# Patient Record
Sex: Female | Born: 1985
Health system: Southern US, Community
[De-identification: ages and names within clinical notes are randomized; demographics above are authoritative.]

## PROBLEM LIST (undated history)

## (undated) ENCOUNTER — Inpatient Hospital Stay (HOSPITAL_COMMUNITY): Payer: Self-pay

## (undated) DIAGNOSIS — Z862 Personal history of diseases of the blood and blood-forming organs and certain disorders involving the immune mechanism: Secondary | ICD-10-CM

## (undated) DIAGNOSIS — I10 Essential (primary) hypertension: Secondary | ICD-10-CM

## (undated) DIAGNOSIS — R51 Headache: Secondary | ICD-10-CM

## (undated) DIAGNOSIS — Z808 Family history of malignant neoplasm of other organs or systems: Secondary | ICD-10-CM

## (undated) DIAGNOSIS — Z8759 Personal history of other complications of pregnancy, childbirth and the puerperium: Secondary | ICD-10-CM

## (undated) DIAGNOSIS — L989 Disorder of the skin and subcutaneous tissue, unspecified: Secondary | ICD-10-CM

## (undated) DIAGNOSIS — D649 Anemia, unspecified: Secondary | ICD-10-CM

## (undated) DIAGNOSIS — G43909 Migraine, unspecified, not intractable, without status migrainosus: Secondary | ICD-10-CM

## (undated) DIAGNOSIS — Z973 Presence of spectacles and contact lenses: Secondary | ICD-10-CM

## (undated) DIAGNOSIS — Z803 Family history of malignant neoplasm of breast: Secondary | ICD-10-CM

## (undated) DIAGNOSIS — Z8042 Family history of malignant neoplasm of prostate: Secondary | ICD-10-CM

## (undated) DIAGNOSIS — O139 Gestational [pregnancy-induced] hypertension without significant proteinuria, unspecified trimester: Secondary | ICD-10-CM

## (undated) DIAGNOSIS — R102 Pelvic and perineal pain unspecified side: Secondary | ICD-10-CM

## (undated) HISTORY — PX: EYE SURGERY: SHX253

## (undated) HISTORY — DX: Family history of malignant neoplasm of prostate: Z80.42

## (undated) HISTORY — DX: Family history of malignant neoplasm of other organs or systems: Z80.8

## (undated) HISTORY — PX: CHOLESTEATOMA EXCISION: SHX1345

## (undated) HISTORY — DX: Family history of malignant neoplasm of breast: Z80.3

## (undated) HISTORY — PX: LASIK: SHX215

## (undated) HISTORY — PX: MELANOMA EXCISION: SHX5266

## (undated) HISTORY — PX: TUBAL LIGATION: SHX77

## (undated) HISTORY — PX: ABDOMINAL HYSTERECTOMY: SHX81

## (undated) HISTORY — PX: KNEE ARTHROSCOPY: SUR90

## (undated) HISTORY — PX: TONSILLECTOMY: SUR1361

## (undated) HISTORY — PX: CHOLECYSTECTOMY: SHX55

---
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
## 2004-08-07 ENCOUNTER — Ambulatory Visit (HOSPITAL_BASED_OUTPATIENT_CLINIC_OR_DEPARTMENT_OTHER): Admission: RE | Admit: 2004-08-07 | Discharge: 2004-08-07 | Payer: Self-pay | Admitting: Surgery

## 2004-08-07 ENCOUNTER — Ambulatory Visit (HOSPITAL_COMMUNITY): Admission: RE | Admit: 2004-08-07 | Discharge: 2004-08-07 | Payer: Self-pay | Admitting: Surgery

## 2004-08-07 ENCOUNTER — Encounter (INDEPENDENT_AMBULATORY_CARE_PROVIDER_SITE_OTHER): Payer: Self-pay | Admitting: Specialist

## 2004-08-07 HISTORY — PX: NEVUS EXCISION: SHX2090

## 2004-10-16 ENCOUNTER — Emergency Department (HOSPITAL_COMMUNITY): Admission: EM | Admit: 2004-10-16 | Discharge: 2004-10-16 | Payer: Self-pay | Admitting: *Deleted

## 2004-11-06 ENCOUNTER — Ambulatory Visit: Payer: Self-pay | Admitting: Orthopedic Surgery

## 2004-11-17 ENCOUNTER — Encounter (HOSPITAL_COMMUNITY): Admission: RE | Admit: 2004-11-17 | Discharge: 2004-12-19 | Payer: Self-pay | Admitting: Orthopedic Surgery

## 2004-12-10 ENCOUNTER — Other Ambulatory Visit: Admission: RE | Admit: 2004-12-10 | Discharge: 2004-12-10 | Payer: Self-pay | Admitting: Gynecology

## 2004-12-22 ENCOUNTER — Ambulatory Visit: Payer: Self-pay | Admitting: Orthopedic Surgery

## 2005-10-26 ENCOUNTER — Ambulatory Visit: Payer: Self-pay | Admitting: Orthopedic Surgery

## 2005-11-25 ENCOUNTER — Ambulatory Visit (HOSPITAL_COMMUNITY): Admission: RE | Admit: 2005-11-25 | Discharge: 2005-11-25 | Payer: Self-pay | Admitting: Orthopedic Surgery

## 2005-12-02 ENCOUNTER — Ambulatory Visit: Payer: Self-pay | Admitting: Orthopedic Surgery

## 2005-12-15 ENCOUNTER — Other Ambulatory Visit: Admission: RE | Admit: 2005-12-15 | Discharge: 2005-12-15 | Payer: Self-pay | Admitting: Gynecology

## 2006-03-10 ENCOUNTER — Ambulatory Visit: Payer: Self-pay | Admitting: Orthopedic Surgery

## 2006-04-06 HISTORY — PX: CHOLECYSTECTOMY, LAPAROSCOPIC: SHX56

## 2006-12-27 ENCOUNTER — Other Ambulatory Visit: Admission: RE | Admit: 2006-12-27 | Discharge: 2006-12-27 | Payer: Self-pay | Admitting: Gynecology

## 2007-04-07 HISTORY — PX: KNEE ARTHROSCOPY: SUR90

## 2007-12-21 ENCOUNTER — Other Ambulatory Visit: Admission: RE | Admit: 2007-12-21 | Discharge: 2007-12-21 | Payer: Self-pay | Admitting: Gynecology

## 2009-01-31 ENCOUNTER — Encounter: Admission: RE | Admit: 2009-01-31 | Discharge: 2009-01-31 | Payer: Self-pay | Admitting: Internal Medicine

## 2010-08-22 NOTE — Op Note (Signed)
[name], [name]             ACCOUNT NO.:  [id number]   MEDICAL RECORD NO.:  [id number]          PATIENT TYPE:  AMB   LOCATION:  DSC                          FACILITY:  [facility]   PHYSICIAN:  Currie Paris, M.D.DATE OF BIRTH:  [birth year]   DATE OF PROCEDURE:  08/07/2004  DATE OF DISCHARGE:                                 OPERATIVE REPORT   MEDICAL DECISION MAKING:  [id number].   PREOPERATIVE DIAGNOSIS:  Multiple dysplastic nevi with atypia.   POSTOPERATIVE DIAGNOSIS:  Multiple dysplastic nevi with atypia.   OPERATION/PROCEDURE:  Excision for nevi.   SURGEON:  Dr. Jamey Scott.   ANESTHESIA:  MAC.   CLINICAL HISTORY:  Barbara Scott is a 25 year old who has had a nevus removed from  the left upper breast and three from the back.  The breast and two of the  back ones showed moderate to marked melanocytic atypia and the fourth one  showed slight to moderate monocytic atypia.  It was decided to re-excise  these areas with wider margins planning on a 4 mm margin for the ones with  moderate to marked atypical and a 1 mm margin on the one with the slight  atypia.   DESCRIPTION OF PROCEDURE:  The patient was seen in the holding area and had  no further questions.  All four areas were identified by the patient and  marked by me.   She was taken to the operating room and after satisfactory IV sedation, the  area of the left breast was prepped and draped and the time out occurred.  I  infiltrated 1% Xylocaine with epinephrine.  I outlined and I made a circle  around the lesion so that I had a 4 mm margin and then outlined an  elliptical incision to excise this.  The incision was made and the skin and  site of the previous nevus excision removed and labled and sent to  pathology.  The incision was closed in layers with 3-0 Vicryl followed by 6-  0 Prolene.  A sterile dressing was applied.  This was a 4 cm incision.   The patient was then turned prone and the back prepped and draped.  All  three areas were anesthetized again with 1% Xylocaine with epinephrine.  Again, circular areas were marked for margins around each and an elliptical  incision outlined.  The lower back one was done with a vertical incision in  the midline, the other two with transverse  incisions.  Each was excised and sent to pathology with appropriate labeling  for orientation.  Each incision was closed in layers with 3-0 Vicryl and 6-0  Prolene.  Two of the areas measured 4 cm and the third 3 cm.   The patient tolerated the procedure well.  There were no intraoperative  complications.  All counts were correct.      CJS/MEDQ  D:  08/07/2004  T:  08/07/2004  Job:  [id number]

## 2010-12-09 ENCOUNTER — Other Ambulatory Visit: Payer: Self-pay | Admitting: Physician Assistant

## 2010-12-09 ENCOUNTER — Ambulatory Visit
Admission: RE | Admit: 2010-12-09 | Discharge: 2010-12-09 | Disposition: A | Discharge status: Home / Self Care | Payer: Self-pay | Source: Ambulatory Visit | Attending: Physician Assistant | Admitting: Physician Assistant

## 2010-12-09 DIAGNOSIS — S96919A Strain of unspecified muscle and tendon at ankle and foot level, unspecified foot, initial encounter: Secondary | ICD-10-CM

## 2012-07-20 LAB — OB RESULTS CONSOLE RPR: RPR: NONREACTIVE

## 2012-07-20 LAB — OB RESULTS CONSOLE GC/CHLAMYDIA
Chlamydia: NEGATIVE
Gonorrhea: NEGATIVE

## 2012-07-20 LAB — OB RESULTS CONSOLE ABO/RH: "RH Type ": POSITIVE

## 2012-07-20 LAB — OB RESULTS CONSOLE HEPATITIS B SURFACE ANTIGEN: Hepatitis B Surface Ag: NEGATIVE

## 2012-07-20 LAB — OB RESULTS CONSOLE HIV ANTIBODY (ROUTINE TESTING): HIV: NONREACTIVE

## 2012-07-20 LAB — OB RESULTS CONSOLE ANTIBODY SCREEN: Antibody Screen: NEGATIVE

## 2012-07-20 LAB — OB RESULTS CONSOLE RUBELLA ANTIBODY, IGM: Rubella: IMMUNE

## 2013-01-25 LAB — OB RESULTS CONSOLE GBS: GBS: NEGATIVE

## 2013-02-11 ENCOUNTER — Inpatient Hospital Stay (EMERGENCY_DEPARTMENT_HOSPITAL)
Admission: AD | Admit: 2013-02-11 | Discharge: 2013-02-11 | Disposition: A | Discharge status: Home / Self Care | Payer: 59 | Source: Ambulatory Visit | Attending: Obstetrics and Gynecology | Admitting: Obstetrics and Gynecology

## 2013-02-11 ENCOUNTER — Encounter (HOSPITAL_COMMUNITY): Payer: Self-pay | Admitting: *Deleted

## 2013-02-11 DIAGNOSIS — O479 False labor, unspecified: Secondary | ICD-10-CM

## 2013-02-11 DIAGNOSIS — O139 Gestational [pregnancy-induced] hypertension without significant proteinuria, unspecified trimester: Secondary | ICD-10-CM

## 2013-02-11 DIAGNOSIS — IMO0002 Reserved for concepts with insufficient information to code with codable children: Secondary | ICD-10-CM | POA: Insufficient documentation

## 2013-02-11 LAB — CBC
HCT: 35 % — ABNORMAL LOW (ref 36.0–46.0)
Hemoglobin: 11.5 g/dL — ABNORMAL LOW (ref 12.0–15.0)
MCH: 28 pg (ref 26.0–34.0)
MCHC: 32.9 g/dL (ref 30.0–36.0)
MCV: 85.2 fL (ref 78.0–100.0)
Platelets: 220 10*3/uL (ref 150–400)
RBC: 4.11 MIL/uL (ref 3.87–5.11)
RDW: 14.8 % (ref 11.5–15.5)
WBC: 12.8 10*3/uL — ABNORMAL HIGH (ref 4.0–10.5)

## 2013-02-11 LAB — URINALYSIS, ROUTINE W REFLEX MICROSCOPIC
Bilirubin Urine: NEGATIVE
Glucose, UA: NEGATIVE mg/dL
Ketones, ur: NEGATIVE mg/dL
Nitrite: NEGATIVE
Protein, ur: NEGATIVE mg/dL
Specific Gravity, Urine: 1.015 (ref 1.005–1.030)
Urobilinogen, UA: 0.2 mg/dL (ref 0.0–1.0)
pH: 7 (ref 5.0–8.0)

## 2013-02-11 LAB — COMPREHENSIVE METABOLIC PANEL
ALT: 7 U/L (ref 0–35)
AST: 13 U/L (ref 0–37)
Albumin: 2.5 g/dL — ABNORMAL LOW (ref 3.5–5.2)
Alkaline Phosphatase: 143 U/L — ABNORMAL HIGH (ref 39–117)
BUN: 6 mg/dL (ref 6–23)
CO2: 23 mEq/L (ref 19–32)
Calcium: 9 mg/dL (ref 8.4–10.5)
Chloride: 106 mEq/L (ref 96–112)
Creatinine, Ser: 0.65 mg/dL (ref 0.50–1.10)
GFR calc Af Amer: >90 mL/min (ref >90)
GFR calc non Af Amer: >90 mL/min (ref >90)
Glucose, Bld: 80 mg/dL (ref 70–99)
Potassium: 4 mEq/L (ref 3.5–5.1)
Sodium: 139 mEq/L (ref 135–145)
Total Bilirubin: 0.2 mg/dL — ABNORMAL LOW (ref 0.3–1.2)
Total Protein: 5.5 g/dL — ABNORMAL LOW (ref 6.0–8.3)

## 2013-02-11 LAB — URIC ACID: Uric Acid, Serum: 7.2 mg/dL — ABNORMAL HIGH (ref 2.4–7.0)

## 2013-02-11 LAB — URINE MICROSCOPIC-ADD ON

## 2013-02-11 LAB — LACTATE DEHYDROGENASE: LDH: 147 U/L (ref 94–250)

## 2013-02-11 NOTE — MAU Provider Note (Signed)
History     CSN: [id number]  Arrival date and time: 02/11/13 4098   None     Chief Complaint  Patient presents with  . Contractions   HPI This is a 27 y.o. female at [redacted]w[redacted]d who presents for labor evaluation. She reports increased swelling in legs and seeing spots at times. No current headache, has had a couple this week.    RN Note: PT SAYS SHE HAS BEEN SWELLING IN LEGS- NOT GOING DOWN OVER NIGHT. LABS DRAW LAST WED- ALL NL. 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 Marland Kitchen SHE DENIES H/A- BUT HAS SEEN SPOTS.      OB History   Grav Para Term Preterm Abortions TAB SAB Ect Mult Living   1               History reviewed. No pertinent past medical history.  Past Surgical History  Procedure Laterality Date  . Tonsillectomy    . Eye surgery    . Knee arthroscopy    . Cholesteatoma excision    . Cholecystectomy    . Melanoma excision      History reviewed. No pertinent family history.  History  Substance Use Topics  . Smoking status: Never Smoker   . Smokeless tobacco: Not on file  . Alcohol Use: No    Allergies:  Allergies  Allergen Reactions  . Shrimp [Shellfish Allergy] Swelling  . Vicodin [Hydrocodone-Acetaminophen] Itching    Prescriptions prior to admission  Medication Sig Dispense Refill  . Prenatal Vit-Fe Fumarate-FA (PRENATAL MULTIVITAMIN) TABS tablet Take 1 tablet by mouth daily at 12 noon.        Review of Systems  Constitutional: Negative for fever and malaise/fatigue.  Eyes: Positive for blurred vision (seeing spots).  Gastrointestinal: Positive for abdominal pain (with contractions). Negative for nausea and vomiting.  Neurological: Negative for dizziness, focal weakness and headaches.   Physical Exam   Blood pressure 119/78, pulse 95, temperature 98.5 F (36.9 C), resp. rate 20, height 5\' 5"  (1.651 m), weight 219 lb 6.4 oz (99.519 kg).  Physical Exam  Constitutional: She is oriented to person, place, and time. She appears well-developed  and well-nourished. No distress.  HENT:  Head: Normocephalic.  Cardiovascular: Normal rate.   Respiratory: Effort normal.  GI: Soft. There is no tenderness. There is no rebound and no guarding.  Genitourinary: Vagina normal and uterus normal. No vaginal discharge found.  Dilation: 3.5 Effacement (%): 70 Cervical Position: Posterior Station: -2 Presentation: Vertex Exam by:: Anuhea Gassner CNM   Musculoskeletal: Normal range of motion. She exhibits edema (2+ lower extremities, 1+ hands).  Neurological: She is alert and oriented to person, place, and time. She has normal reflexes. She displays normal reflexes (1 beat clonus).  Skin: Skin is warm and dry.  Psychiatric: She has a normal mood and affect.  FHR reactive Irregular contractions every 4 minutes Results for orders placed during the hospital encounter of 02/11/13 (from the past 72 hour(s))  URINALYSIS, ROUTINE W REFLEX MICROSCOPIC     Status: Abnormal   Collection Time    02/11/13  6:00 AM      Result Value Range   Color, Urine YELLOW  YELLOW   APPearance CLEAR  CLEAR   Specific Gravity, Urine 1.015  1.005 - 1.030   pH 7.0  5.0 - 8.0   Glucose, UA NEGATIVE  NEGATIVE mg/dL   Hgb urine dipstick MODERATE (*) NEGATIVE   Bilirubin Urine NEGATIVE  NEGATIVE   Ketones, ur  NEGATIVE  NEGATIVE mg/dL   Protein, ur NEGATIVE  NEGATIVE mg/dL   Urobilinogen, UA 0.2  0.0 - 1.0 mg/dL   Nitrite NEGATIVE  NEGATIVE   Leukocytes, UA MODERATE (*) NEGATIVE  URINE MICROSCOPIC-ADD ON     Status: Abnormal   Collection Time    02/11/13  6:00 AM      Result Value Range   Squamous Epithelial / LPF FEW (*) RARE   WBC, UA 11-20  <3 WBC/hpf   RBC / HPF 7-10  <3 RBC/hpf   Bacteria, UA MANY (*) RARE  CBC     Status: Abnormal   Collection Time    02/11/13  6:48 AM      Result Value Range   WBC 12.8 (*) 4.0 - 10.5 K/uL   RBC 4.11  3.87 - 5.11 MIL/uL   Hemoglobin 11.5 (*) 12.0 - 15.0 g/dL   HCT 16.1 (*) 09.6 - 04.5 %   MCV 85.2  78.0 - 100.0 fL   MCH  28.0  26.0 - 34.0 pg   MCHC 32.9  30.0 - 36.0 g/dL   RDW 40.9  81.1 - 91.4 %   Platelets 220  150 - 400 K/uL  COMPREHENSIVE METABOLIC PANEL     Status: Abnormal   Collection Time    02/11/13  6:48 AM      Result Value Range   Sodium 139  135 - 145 mEq/L   Potassium 4.0  3.5 - 5.1 mEq/L   Chloride 106  96 - 112 mEq/L   CO2 23  19 - 32 mEq/L   Glucose, Bld 80  70 - 99 mg/dL   BUN 6  6 - 23 mg/dL   Creatinine, Ser 7.82  0.50 - 1.10 mg/dL   Calcium 9.0  8.4 - 95.6 mg/dL   Total Protein 5.5 (*) 6.0 - 8.3 g/dL   Albumin 2.5 (*) 3.5 - 5.2 g/dL   AST 13  0 - 37 U/L   ALT 7  0 - 35 U/L   Alkaline Phosphatase 143 (*) 39 - 117 U/L   Total Bilirubin 0.2 (*) 0.3 - 1.2 mg/dL   GFR calc non Af Amer >90  >90 mL/min   GFR calc Af Amer >90  >90 mL/min   Comment: (NOTE)     The eGFR has been calculated using the CKD EPI equation.     This calculation has not been validated in all clinical situations.     eGFR's persistently <90 mL/min signify possible Chronic Kidney     Disease.  LACTATE DEHYDROGENASE     Status: None   Collection Time    02/11/13  6:48 AM      Result Value Range   LDH 147  94 - 250 U/L  URIC ACID     Status: Abnormal   Collection Time    02/11/13  6:48 AM      Result Value Range   Uric Acid, Serum 7.2 (*) 2.4 - 7.0 mg/dL     MAU Course  Procedures  MDM Discussed with Dr Henderson Cloud, will extend labor check  Assessment and Plan  A:  SIUP at [redacted]w[redacted]d        Gestational hypertension with edema, no proteinuria  P:  Observe       Report to oncoming provider  [facility] 02/11/2013, 7:32 AM

## 2013-02-11 NOTE — MAU Note (Signed)
PT SAYS  SHE HAS BEEN SWELLING IN LEGS-  NOT GOING DOWN OVER NIGHT.  LABS DRAW LAST WED- ALL NL.  DR  TOOK OUT OF WORK  ON WED- PT IS  AN OR NURSE. Barbara Scott   SHE DENIES H/A- BUT HAS SEEN SPOTS.

## 2013-02-11 NOTE — MAU Note (Signed)
Water pitcher given.

## 2013-02-11 NOTE — MAU Note (Signed)
Cramping a lot yesterday. Having regular contractions this morning.

## 2013-02-12 LAB — URINE CULTURE: Colony Count: 25000

## 2013-02-13 ENCOUNTER — Inpatient Hospital Stay (EMERGENCY_DEPARTMENT_HOSPITAL)
Admission: AD | Admit: 2013-02-13 | Discharge: 2013-02-13 | Disposition: A | Discharge status: Home / Self Care | Payer: 59 | Source: Ambulatory Visit | Attending: Obstetrics and Gynecology | Admitting: Obstetrics and Gynecology

## 2013-02-13 ENCOUNTER — Encounter (HOSPITAL_COMMUNITY): Payer: Self-pay | Admitting: *Deleted

## 2013-02-13 ENCOUNTER — Telehealth (HOSPITAL_COMMUNITY): Payer: Self-pay | Admitting: *Deleted

## 2013-02-13 DIAGNOSIS — O133 Gestational [pregnancy-induced] hypertension without significant proteinuria, third trimester: Secondary | ICD-10-CM

## 2013-02-13 DIAGNOSIS — O139 Gestational [pregnancy-induced] hypertension without significant proteinuria, unspecified trimester: Secondary | ICD-10-CM

## 2013-02-13 HISTORY — DX: Headache: R51

## 2013-02-13 LAB — COMPREHENSIVE METABOLIC PANEL
ALT: 7 U/L (ref 0–35)
AST: 14 U/L (ref 0–37)
Albumin: 2.6 g/dL — ABNORMAL LOW (ref 3.5–5.2)
Alkaline Phosphatase: 143 U/L — ABNORMAL HIGH (ref 39–117)
BUN: 9 mg/dL (ref 6–23)
CO2: 21 mEq/L (ref 19–32)
Calcium: 9 mg/dL (ref 8.4–10.5)
Chloride: 105 mEq/L (ref 96–112)
Creatinine, Ser: 0.57 mg/dL (ref 0.50–1.10)
GFR calc Af Amer: >90 mL/min (ref >90)
GFR calc non Af Amer: >90 mL/min (ref >90)
Glucose, Bld: 78 mg/dL (ref 70–99)
Potassium: 4.1 mEq/L (ref 3.5–5.1)
Sodium: 136 mEq/L (ref 135–145)
Total Bilirubin: 0.2 mg/dL — ABNORMAL LOW (ref 0.3–1.2)
Total Protein: 5.6 g/dL — ABNORMAL LOW (ref 6.0–8.3)

## 2013-02-13 LAB — URINE MICROSCOPIC-ADD ON

## 2013-02-13 LAB — CBC
HCT: 34.9 % — ABNORMAL LOW (ref 36.0–46.0)
Hemoglobin: 11.5 g/dL — ABNORMAL LOW (ref 12.0–15.0)
MCH: 28.1 pg (ref 26.0–34.0)
MCHC: 33 g/dL (ref 30.0–36.0)
MCV: 85.3 fL (ref 78.0–100.0)
Platelets: 228 10*3/uL (ref 150–400)
RBC: 4.09 MIL/uL (ref 3.87–5.11)
RDW: 14.7 % (ref 11.5–15.5)
WBC: 12 10*3/uL — ABNORMAL HIGH (ref 4.0–10.5)

## 2013-02-13 LAB — URINALYSIS, ROUTINE W REFLEX MICROSCOPIC
Bilirubin Urine: NEGATIVE
Glucose, UA: NEGATIVE mg/dL
Ketones, ur: NEGATIVE mg/dL
Nitrite: NEGATIVE
Protein, ur: NEGATIVE mg/dL
Specific Gravity, Urine: 1.015 (ref 1.005–1.030)
Urobilinogen, UA: 0.2 mg/dL (ref 0.0–1.0)
pH: 7.5 (ref 5.0–8.0)

## 2013-02-13 LAB — LACTATE DEHYDROGENASE: LDH: 156 U/L (ref 94–250)

## 2013-02-13 LAB — URIC ACID: Uric Acid, Serum: 6.3 mg/dL (ref 2.4–7.0)

## 2013-02-13 NOTE — Telephone Encounter (Signed)
Preadmission screen  

## 2013-02-13 NOTE — MAU Note (Signed)
States was seen in MAU on Saturday with pain and elevated BP. States was 3.5cm. States through the night, had vaginal pain and lower abdomen. Hurts to move. Husband checked her BP and states it was elevated. States they went to MD office, was told no MD there yet so they came to MAU.

## 2013-02-13 NOTE — MAU Provider Note (Signed)
History     CSN: [id number]  Arrival date and time: 02/13/13 4098   First Provider Initiated Contact with Patient 02/13/13 [phone]      Chief Complaint  Patient presents with  . Hypertension  . Vaginal Pain   HPI Barbara Scott is a 27 y.o. G1P0 at [redacted]w[redacted]d who presents to MAU today with elevated BP. The patient states that she was in the shower this morning and was seeing floaters. Her husband took her BP and states it was 168/98 and 150/90 this morning. She states that her OB has been monitoring her BP x 1 month. She has been having peripheral edema, pressures in the lower abdomen and headache. She rates her headache now at 4/10. She denies taking any pain medications and states that "Tylenol doesn't work anymore." she denies blurred vision or RUQ pain. She denies vaginal bleeding, discharge or LOF. She reports contractions q 5-10 minutes since Saturday. The patient was seen here on Saturday with similar issues and had a complete work-up for pre-eclampsia that was mostly normal. The patient was dilated to 3.5 cm on Saturday as well.   OB History   Grav Para Term Preterm Abortions TAB SAB Ect Mult Living   1         0      Past Medical History  Diagnosis Date  . Headache(784.0)     migraines  . Kidney stones     Past Surgical History  Procedure Laterality Date  . Tonsillectomy    . Eye surgery    . Knee arthroscopy    . Cholesteatoma excision    . Cholecystectomy    . Melanoma excision      History reviewed. No pertinent family history.  History  Substance Use Topics  . Smoking status: Never Smoker   . Smokeless tobacco: Not on file  . Alcohol Use: No    Allergies:  Allergies  Allergen Reactions  . Shrimp [Shellfish Allergy] Swelling  . Vicodin [Hydrocodone-Acetaminophen] Itching    Prescriptions prior to admission  Medication Sig Dispense Refill  . Prenatal Vit-Fe Fumarate-FA (PRENATAL MULTIVITAMIN) TABS tablet Take 1 tablet by mouth daily at 12 noon.         Review of Systems  Constitutional: Negative for malaise/fatigue.  Eyes: Negative for blurred vision and double vision.  Gastrointestinal: Positive for abdominal pain. Negative for nausea, vomiting, diarrhea and constipation.  Genitourinary: Negative for dysuria, urgency and frequency.       Neg - vaginal bleeding, discharge, LOF  Neurological: Positive for headaches.   Physical Exam   Blood pressure 134/89, pulse 77, height 5\' 5"  (1.651 m), weight 220 lb (99.791 kg).  Physical Exam  Constitutional: She is oriented to person, place, and time. She appears well-developed and well-nourished. No distress.  HENT:  Head: Normocephalic and atraumatic.  Cardiovascular: Normal rate, regular rhythm and normal heart sounds.   Respiratory: Effort normal and breath sounds normal. No respiratory distress.  GI: Soft. Bowel sounds are normal. She exhibits no distension and no mass. There is tenderness (mild tenderness to palpation of the lower abdomen and RUQ). There is no rebound and no guarding.  Musculoskeletal: She exhibits edema (1+ pitting edema to the knee bilaterally).  Neurological: She is alert and oriented to person, place, and time. She has normal reflexes.  No clonus  Skin: Skin is warm and dry. No erythema.  Psychiatric: She has a normal mood and affect.  Dilation: 3.5 Effacement (%): 60 Cervical Position: Posterior Station: -2  Presentation: Vertex Exam by:: Sarajane Marek, RNC   Results for orders placed during the hospital encounter of 02/13/13 (from the past 24 hour(s))  URINALYSIS, ROUTINE W REFLEX MICROSCOPIC     Status: Abnormal   Collection Time    02/13/13  8:45 AM      Result Value Range   Color, Urine YELLOW  YELLOW   APPearance HAZY (*) CLEAR   Specific Gravity, Urine 1.015  1.005 - 1.030   pH 7.5  5.0 - 8.0   Glucose, UA NEGATIVE  NEGATIVE mg/dL   Hgb urine dipstick SMALL (*) NEGATIVE   Bilirubin Urine NEGATIVE  NEGATIVE   Ketones, ur NEGATIVE  NEGATIVE mg/dL    Protein, ur NEGATIVE  NEGATIVE mg/dL   Urobilinogen, UA 0.2  0.0 - 1.0 mg/dL   Nitrite NEGATIVE  NEGATIVE   Leukocytes, UA SMALL (*) NEGATIVE  URINE MICROSCOPIC-ADD ON     Status: Abnormal   Collection Time    02/13/13  8:45 AM      Result Value Range   Squamous Epithelial / LPF MANY (*) RARE   WBC, UA 3-6  <3 WBC/hpf   RBC / HPF 3-6  <3 RBC/hpf   Bacteria, UA FEW (*) RARE  CBC     Status: Abnormal   Collection Time    02/13/13  9:52 AM      Result Value Range   WBC 12.0 (*) 4.0 - 10.5 K/uL   RBC 4.09  3.87 - 5.11 MIL/uL   Hemoglobin 11.5 (*) 12.0 - 15.0 g/dL   HCT 16.1 (*) 09.6 - 04.5 %   MCV 85.3  78.0 - 100.0 fL   MCH 28.1  26.0 - 34.0 pg   MCHC 33.0  30.0 - 36.0 g/dL   RDW 40.9  81.1 - 91.4 %   Platelets 228  150 - 400 K/uL  COMPREHENSIVE METABOLIC PANEL     Status: Abnormal   Collection Time    02/13/13  9:52 AM      Result Value Range   Sodium 136  135 - 145 mEq/L   Potassium 4.1  3.5 - 5.1 mEq/L   Chloride 105  96 - 112 mEq/L   CO2 21  19 - 32 mEq/L   Glucose, Bld 78  70 - 99 mg/dL   BUN 9  6 - 23 mg/dL   Creatinine, Ser 7.82  0.50 - 1.10 mg/dL   Calcium 9.0  8.4 - 95.6 mg/dL   Total Protein 5.6 (*) 6.0 - 8.3 g/dL   Albumin 2.6 (*) 3.5 - 5.2 g/dL   AST 14  0 - 37 U/L   ALT 7  0 - 35 U/L   Alkaline Phosphatase 143 (*) 39 - 117 U/L   Total Bilirubin 0.2 (*) 0.3 - 1.2 mg/dL   GFR calc non Af Amer >90  >90 mL/min   GFR calc Af Amer >90  >90 mL/min  URIC ACID     Status: None   Collection Time    02/13/13  9:52 AM      Result Value Range   Uric Acid, Serum 6.3  2.4 - 7.0 mg/dL  LACTATE DEHYDROGENASE     Status: None   Collection Time    02/13/13  9:52 AM      Result Value Range   LDH 156  94 - 250 U/L    Patient Vitals for the past 24 hrs:  BP Pulse Height Weight  02/13/13 0951 134/89 mmHg 77 - -  02/13/13 0941 131/79 mmHg 84 - -  02/13/13 0931 135/90 mmHg 81 - -  02/13/13 0921 136/86 mmHg 87 - -  02/13/13 0911 148/96 mmHg 96 - -  02/13/13 0901 145/84  mmHg 89 - -  02/13/13 0855 140/88 mmHg 88 - -  02/13/13 0845 - - 5\' 5"  (1.651 m) 220 lb (99.791 kg)    Fetal Monitoring: Baseline: 135 bpm, moderate variability, + accelerations, one variable deceleration Contractions: q 5-9 minutes  MAU Course  Procedures None  MDM Discussed with Dr. Renaldo Fiddler. Repeat labs and continue to monitor blood pressures.  Discussed labs and patient BPs with Dr. Renaldo Fiddler. She is ok with discharge at this time. Has patient scheduled for induction tomorrow with Dr. Marcelle Overlie. Would like patient to call the office today for instructions about induction.  Assessment and Plan  A: Gestational HTN  P: Discharge home Pre-eclampsia warning signs reviewed Patient advised to call the office today for induction instructions Patient may return to MAU as needed or if her condition were to change or worsen  Freddi Starr, PA-C  02/13/2013, 10:54 AM

## 2013-02-14 ENCOUNTER — Encounter (HOSPITAL_COMMUNITY): Payer: Self-pay

## 2013-02-14 ENCOUNTER — Inpatient Hospital Stay (HOSPITAL_COMMUNITY)
Admission: AD | Admit: 2013-02-14 | Discharge: 2013-02-18 | DRG: 775 | Disposition: A | Discharge status: Home / Self Care | Payer: 59 | Source: Ambulatory Visit | Attending: Obstetrics and Gynecology | Admitting: Obstetrics and Gynecology

## 2013-02-14 ENCOUNTER — Inpatient Hospital Stay (HOSPITAL_COMMUNITY): Payer: 59 | Admitting: Anesthesiology

## 2013-02-14 ENCOUNTER — Encounter (HOSPITAL_COMMUNITY): Payer: 59 | Admitting: Anesthesiology

## 2013-02-14 DIAGNOSIS — O139 Gestational [pregnancy-induced] hypertension without significant proteinuria, unspecified trimester: Principal | ICD-10-CM | POA: Diagnosis present

## 2013-02-14 LAB — URINE CULTURE

## 2013-02-14 LAB — CBC
Hemoglobin: 11 g/dL — ABNORMAL LOW (ref 12.0–15.0)
Hemoglobin: 11.4 g/dL — ABNORMAL LOW (ref 12.0–15.0)
MCH: 28.4 pg (ref 26.0–34.0)
MCHC: 32.7 g/dL (ref 30.0–36.0)
MCV: 85.7 fL (ref 78.0–100.0)
RBC: 3.87 MIL/uL (ref 3.87–5.11)
RBC: 4.07 MIL/uL (ref 3.87–5.11)
RDW: 14.5 % (ref 11.5–15.5)
WBC: 10.6 10*3/uL — ABNORMAL HIGH (ref 4.0–10.5)
WBC: 21.8 10*3/uL — ABNORMAL HIGH (ref 4.0–10.5)

## 2013-02-14 LAB — COMPREHENSIVE METABOLIC PANEL
ALT: 7 U/L (ref 0–35)
AST: 15 U/L (ref 0–37)
Albumin: 2.6 g/dL — ABNORMAL LOW (ref 3.5–5.2)
Alkaline Phosphatase: 147 U/L — ABNORMAL HIGH (ref 39–117)
BUN: 8 mg/dL (ref 6–23)
CO2: 22 mEq/L (ref 19–32)
Calcium: 9.4 mg/dL (ref 8.4–10.5)
Chloride: 103 mEq/L (ref 96–112)
GFR calc Af Amer: >90 mL/min (ref >90)
GFR calc non Af Amer: >90 mL/min (ref >90)
Glucose, Bld: 63 mg/dL — ABNORMAL LOW (ref 70–99)
Potassium: 4.7 mEq/L (ref 3.5–5.1)
Sodium: 137 mEq/L (ref 135–145)

## 2013-02-14 LAB — RPR: RPR Ser Ql: NONREACTIVE

## 2013-02-14 LAB — TYPE AND SCREEN: Antibody Screen: NEGATIVE

## 2013-02-14 MED ORDER — IBUPROFEN 600 MG PO TABS: 600.0000 mg | ORAL_TABLET | Freq: Four times a day (QID) | ORAL | Status: DC | PRN

## 2013-02-14 MED ORDER — PHENYLEPHRINE 40 MCG/ML (10ML) SYRINGE FOR IV PUSH (FOR BLOOD PRESSURE SUPPORT)
80.0000 ug | PREFILLED_SYRINGE | INTRAVENOUS | Status: DC | PRN
  Filled 2013-02-14: qty 2
  Filled 2013-02-14: qty 10

## 2013-02-14 MED ORDER — TERBUTALINE SULFATE 1 MG/ML IJ SOLN: 0.2500 mg | Freq: Once | INTRAMUSCULAR | Status: AC | PRN

## 2013-02-14 MED ORDER — CITRIC ACID-SODIUM CITRATE 334-500 MG/5ML PO SOLN: 30.0000 mL | ORAL | Status: DC | PRN

## 2013-02-14 MED ORDER — OXYTOCIN BOLUS FROM INFUSION
500.0000 mL | INTRAVENOUS | Status: DC
  Administered 2013-02-14: 500 mL via INTRAVENOUS

## 2013-02-14 MED ORDER — OXYCODONE-ACETAMINOPHEN 5-325 MG PO TABS: 1.0000 | ORAL_TABLET | ORAL | Status: DC | PRN

## 2013-02-14 MED ORDER — LIDOCAINE HCL (PF) 1 % IJ SOLN
30.0000 mL | INTRAMUSCULAR | Status: AC | PRN
  Administered 2013-02-14: 30 mL via SUBCUTANEOUS
  Filled 2013-02-14 (×2): qty 30

## 2013-02-14 MED ORDER — EPHEDRINE 5 MG/ML INJ
10.0000 mg | INTRAVENOUS | Status: DC | PRN
  Filled 2013-02-14: qty 2
  Filled 2013-02-14: qty 4

## 2013-02-14 MED ORDER — EPHEDRINE 5 MG/ML INJ
10.0000 mg | INTRAVENOUS | Status: DC | PRN
  Filled 2013-02-14: qty 2

## 2013-02-14 MED ORDER — FLEET ENEMA 7-19 GM/118ML RE ENEM: 1.0000 | ENEMA | RECTAL | Status: DC | PRN

## 2013-02-14 MED ORDER — ACETAMINOPHEN 325 MG PO TABS
650.0000 mg | ORAL_TABLET | ORAL | Status: DC | PRN
  Administered 2013-02-14: 650 mg via ORAL
  Filled 2013-02-14: qty 2

## 2013-02-14 MED ORDER — BUTORPHANOL TARTRATE 1 MG/ML IJ SOLN
1.0000 mg | Freq: Once | INTRAMUSCULAR | Status: AC
  Administered 2013-02-14: 1 mg via INTRAVENOUS

## 2013-02-14 MED ORDER — ONDANSETRON HCL 4 MG/2ML IJ SOLN
4.0000 mg | Freq: Four times a day (QID) | INTRAMUSCULAR | Status: DC | PRN
  Administered 2013-02-14: 4 mg via INTRAVENOUS
  Filled 2013-02-14: qty 2

## 2013-02-14 MED ORDER — LACTATED RINGERS IV SOLN
500.0000 mL | INTRAVENOUS | Status: DC | PRN
  Administered 2013-02-14: 09:00:00 via INTRAVENOUS

## 2013-02-14 MED ORDER — FENTANYL 2.5 MCG/ML BUPIVACAINE 1/10 % EPIDURAL INFUSION (WH - ANES)
14.0000 mL/h | INTRAMUSCULAR | Status: DC | PRN
  Administered 2013-02-14 (×2): 14 mL/h via EPIDURAL
  Filled 2013-02-14 (×2): qty 125

## 2013-02-14 MED ORDER — SODIUM BICARBONATE 8.4 % IV SOLN
INTRAVENOUS | Status: DC | PRN
  Administered 2013-02-14: 5 mL via EPIDURAL

## 2013-02-14 MED ORDER — MAGNESIUM SULFATE BOLUS VIA INFUSION
4.0000 g | Freq: Once | INTRAVENOUS | Status: DC
  Filled 2013-02-14: qty 500

## 2013-02-14 MED ORDER — OXYTOCIN 40 UNITS IN LACTATED RINGERS INFUSION - SIMPLE MED
1.0000 m[IU]/min | INTRAVENOUS | Status: DC
  Administered 2013-02-14: 2 m[IU]/min via INTRAVENOUS
  Filled 2013-02-14: qty 1000

## 2013-02-14 MED ORDER — OXYTOCIN 40 UNITS IN LACTATED RINGERS INFUSION - SIMPLE MED: 62.5000 mL/h | INTRAVENOUS | Status: DC

## 2013-02-14 MED ORDER — LACTATED RINGERS IV SOLN: 500.0000 mL | Freq: Once | INTRAVENOUS | Status: DC

## 2013-02-14 MED ORDER — BUTORPHANOL TARTRATE 1 MG/ML IJ SOLN
INTRAMUSCULAR | Status: AC
  Filled 2013-02-14: qty 2

## 2013-02-14 MED ORDER — DIPHENHYDRAMINE HCL 50 MG/ML IJ SOLN: 12.5000 mg | INTRAMUSCULAR | Status: DC | PRN

## 2013-02-14 MED ORDER — PHENYLEPHRINE 40 MCG/ML (10ML) SYRINGE FOR IV PUSH (FOR BLOOD PRESSURE SUPPORT)
80.0000 ug | PREFILLED_SYRINGE | INTRAVENOUS | Status: DC | PRN
  Administered 2013-02-14: 13:00:00 via INTRAVENOUS
  Filled 2013-02-14: qty 2

## 2013-02-14 MED ORDER — LACTATED RINGERS IV SOLN
INTRAVENOUS | Status: DC
  Administered 2013-02-14: 18:00:00 via INTRAVENOUS

## 2013-02-14 MED ORDER — MAGNESIUM SULFATE 40 G IN LACTATED RINGERS - SIMPLE
2.0000 g/h | INTRAVENOUS | Status: DC
  Administered 2013-02-14: 4 g/h via INTRAVENOUS
  Administered 2013-02-15: 2 g/h via INTRAVENOUS
  Filled 2013-02-14 (×2): qty 500

## 2013-02-14 NOTE — Anesthesia Preprocedure Evaluation (Addendum)

## 2013-02-14 NOTE — Anesthesia Procedure Notes (Signed)

## 2013-02-14 NOTE — Progress Notes (Signed)
Good progress to del vtx with epidural anesth>>>immediadte retraction of vtx against perineum>>>mod downward traction w/o descent, tried corkscrewing shoulders after McRoberts w/ RN suprapubic pressure w/o success.  Decision made to cut MLE and post arm was delivered, using two fingers to splint.  Time lapse ~ 2 min, after arm released, easy del of torso, NICU in attend., AP 6/7 with ph 7.27. Plac spont intact  bilat periurethral lace repaired, partial 3 deg extension repaired, gloved hand used to explore rectum>>intact. Std layered repair, will cont MgSO4, Infant stable in NICU  EBL 450cc

## 2013-02-14 NOTE — H&P (Signed)
Barbara Scott is a 27 y.o. female presenting for IOL due to [facility]. Maternal Medical History:  Fetal activity: Perceived fetal activity is normal.      OB History   Grav Para Term Preterm Abortions TAB SAB Ect Mult Living   1         0     Past Medical History  Diagnosis Date  . Headache(784.0)     migraines  . Abnormal Pap smear     ASCUS   Past Surgical History  Procedure Laterality Date  . Tonsillectomy    . Eye surgery    . Knee arthroscopy    . Cholesteatoma excision    . Cholecystectomy    . Melanoma excision     Family History: family history includes Cancer in her maternal grandfather and mother; Heart attack in her maternal uncle; Hypertension in her maternal aunt, maternal uncle, and mother; Thyroid disease in her mother. Social History:  reports that she has never smoked. She has never used smokeless tobacco. She reports that she does not drink alcohol or use illicit drugs.   Prenatal Transfer Tool  Maternal Diabetes: No Genetic Screening: Normal Maternal Ultrasounds/Referrals: Normal Fetal Ultrasounds or other Referrals:  None Maternal Substance Abuse:  No Significant Maternal Medications:  None Significant Maternal Lab Results:  None Other Comments:  None  ROS    Blood pressure 141/84, pulse 87, temperature 99.2 F (37.3 C), temperature source Oral, resp. rate 18, height 5\' 5"  (1.651 m), weight 220 lb (99.791 kg). Maternal Exam:  Uterine Assessment: Contraction strength is mild.  Abdomen: Patient reports no abdominal tenderness. Fundal height is term FH.   Estimated fetal weight is AGA.   Fetal presentation: vertex  Introitus: Normal vulva. Normal vagina.  Pelvis: adequate for delivery.   Cervix: Cervix evaluated by digital exam.     Physical Exam  Constitutional: She is oriented to person, place, and time. She appears well-developed and well-nourished.  HENT:  Head: Normocephalic and atraumatic.  Neck: Normal range of motion. Neck supple.   Cardiovascular: Normal rate and regular rhythm.   Respiratory: Effort normal and breath sounds normal.  GI:  Term FH  Genitourinary:  3+  AROM>>clr  Musculoskeletal: Normal range of motion. She exhibits edema.  Neurological: She is alert and oriented to person, place, and time.    Prenatal labs: ABO, Rh: A/Positive/-- (04/16 0000) Antibody: Negative (04/16 0000) Rubella: Immune (04/16 0000) RPR: Nonreactive (04/16 0000)  HBsAg: Negative (04/16 0000)  HIV: Non-reactive (04/16 0000)  GBS: Negative (10/22 0000)   Assessment/Plan: [redacted]w[redacted]d / PIH with fav cx   Elese Rane M 02/14/2013, 8:15 AM

## 2013-02-15 ENCOUNTER — Encounter (HOSPITAL_COMMUNITY): Payer: Self-pay

## 2013-02-15 LAB — CBC
HCT: 29.4 % — ABNORMAL LOW (ref 36.0–46.0)
Hemoglobin: 9.8 g/dL — ABNORMAL LOW (ref 12.0–15.0)
MCHC: 33.3 g/dL (ref 30.0–36.0)
MCV: 84.2 fL (ref 78.0–100.0)
RBC: 3.49 MIL/uL — ABNORMAL LOW (ref 3.87–5.11)
WBC: 19.6 10*3/uL — ABNORMAL HIGH (ref 4.0–10.5)

## 2013-02-15 MED ORDER — BENZOCAINE-MENTHOL 20-0.5 % EX AERO
1.0000 "application " | INHALATION_SPRAY | CUTANEOUS | Status: DC | PRN
  Administered 2013-02-15: 1 via TOPICAL
  Filled 2013-02-15: qty 56

## 2013-02-15 MED ORDER — ONDANSETRON HCL 4 MG/2ML IJ SOLN: 4.0000 mg | INTRAMUSCULAR | Status: DC | PRN

## 2013-02-15 MED ORDER — DIPHENHYDRAMINE HCL 25 MG PO CAPS: 25.0000 mg | ORAL_CAPSULE | Freq: Four times a day (QID) | ORAL | Status: DC | PRN

## 2013-02-15 MED ORDER — LACTATED RINGERS IV SOLN
INTRAVENOUS | Status: DC
  Administered 2013-02-15: 06:00:00 via INTRAVENOUS

## 2013-02-15 MED ORDER — ONDANSETRON HCL 4 MG PO TABS: 4.0000 mg | ORAL_TABLET | ORAL | Status: DC | PRN

## 2013-02-15 MED ORDER — TETANUS-DIPHTH-ACELL PERTUSSIS 5-2.5-18.5 LF-MCG/0.5 IM SUSP
0.5000 mL | Freq: Once | INTRAMUSCULAR | Status: DC
  Filled 2013-02-15: qty 0.5

## 2013-02-15 MED ORDER — FLEET ENEMA 7-19 GM/118ML RE ENEM: 1.0000 | ENEMA | Freq: Every day | RECTAL | Status: DC | PRN

## 2013-02-15 MED ORDER — SENNOSIDES-DOCUSATE SODIUM 8.6-50 MG PO TABS
2.0000 | ORAL_TABLET | ORAL | Status: DC
  Administered 2013-02-15 – 2013-02-17 (×4): 2 via ORAL
  Filled 2013-02-15: qty 2
  Filled 2013-02-15: qty 1
  Filled 2013-02-15: qty 2
  Filled 2013-02-15: qty 1
  Filled 2013-02-15: qty 2

## 2013-02-15 MED ORDER — DIBUCAINE 1 % RE OINT: 1.0000 "application " | TOPICAL_OINTMENT | RECTAL | Status: DC | PRN

## 2013-02-15 MED ORDER — MEASLES, MUMPS & RUBELLA VAC ~~LOC~~ INJ
0.5000 mL | INJECTION | Freq: Once | SUBCUTANEOUS | Status: DC
  Filled 2013-02-15: qty 0.5

## 2013-02-15 MED ORDER — SIMETHICONE 80 MG PO CHEW: 80.0000 mg | CHEWABLE_TABLET | ORAL | Status: DC | PRN

## 2013-02-15 MED ORDER — IBUPROFEN 800 MG PO TABS
800.0000 mg | ORAL_TABLET | Freq: Three times a day (TID) | ORAL | Status: DC | PRN
  Administered 2013-02-15 – 2013-02-18 (×9): 800 mg via ORAL
  Filled 2013-02-15 (×9): qty 1

## 2013-02-15 MED ORDER — PRENATAL MULTIVITAMIN CH
1.0000 | ORAL_TABLET | Freq: Every day | ORAL | Status: DC
  Administered 2013-02-15 – 2013-02-17 (×3): 1 via ORAL
  Filled 2013-02-15 (×4): qty 1

## 2013-02-15 MED ORDER — LANOLIN HYDROUS EX OINT: TOPICAL_OINTMENT | CUTANEOUS | Status: DC | PRN

## 2013-02-15 MED ORDER — WITCH HAZEL-GLYCERIN EX PADS: 1.0000 "application " | MEDICATED_PAD | CUTANEOUS | Status: DC | PRN

## 2013-02-15 MED ORDER — BISACODYL 10 MG RE SUPP: 10.0000 mg | Freq: Every day | RECTAL | Status: DC | PRN

## 2013-02-15 MED ORDER — ZOLPIDEM TARTRATE 5 MG PO TABS: 5.0000 mg | ORAL_TABLET | Freq: Every evening | ORAL | Status: DC | PRN

## 2013-02-15 MED ORDER — OXYCODONE-ACETAMINOPHEN 5-325 MG PO TABS
1.0000 | ORAL_TABLET | Freq: Four times a day (QID) | ORAL | Status: DC | PRN
  Administered 2013-02-15: 2 via ORAL
  Administered 2013-02-15: 1 via ORAL
  Administered 2013-02-15 – 2013-02-17 (×5): 2 via ORAL
  Administered 2013-02-18: 1 via ORAL
  Filled 2013-02-15: qty 1
  Filled 2013-02-15 (×5): qty 2
  Filled 2013-02-15: qty 1
  Filled 2013-02-15: qty 2

## 2013-02-15 NOTE — Progress Notes (Signed)
Post Partum Day 1 Subjective: no complaints and voiding Patient denies headache  Objective: Blood pressure 127/84, pulse 83, temperature 98.7 F (37.1 C), temperature source Oral, resp. rate 16, height 5\' 5"  (1.651 m), weight 90.992 kg (200 lb 9.6 oz), SpO2 99.00%, unknown if currently breastfeeding.  Physical Exam:  General: alert, cooperative and appears stated age Lochia: appropriate Uterine Fundus: firm Incision: healing well DVT Evaluation: No evidence of DVT seen on physical exam.   Recent Labs  02/14/13 2345 02/15/13 0524  HGB 11.0* 9.8*  HCT 32.7* 29.4*    Assessment/Plan: Circumcision prior to discharge Discontinue Magnesium and transfer to postpartum   LOS: 1 day   Barbara Scott L 02/15/2013, 7:48 AM

## 2013-02-15 NOTE — Lactation Note (Signed)
This note was copied from the chart of Barbara Scott. Lactation Consultation Note    Follow up consult with this mom of a NICU term baby, now 21 hours post partum. He has been formula fed twice today, and wan not awake before this breast feeding attemtp. He was sleepy, so I placed some EBM/colosrum into his mouth - he swalloed close to 2 mls, latched for a few seconds with a few suckles, and then fell alseep. I advised mom to just do skin to skin, andI will continue to work with mom and baby on breast feeding tomorrow.  Patient Name: Barbara Scott [id number] Date: 02/15/2013 Reason for consult: Follow-up assessment;NICU baby   Maternal Data Formula Feeding for Exclusion: Yes (baby in nicu) Infant to breast within first hour of birth: No Breastfeeding delayed due to:: Infant status Has patient been taught Hand Expression?: Yes Does the patient have breastfeeding experience prior to this delivery?: No  Feeding Feeding Type: Formula Nipple Type: Slow - flow Length of feed: 15 min  LATCH Score/Interventions Latch: Too sleepy or reluctant, no latch achieved, no sucking elicited. Intervention(s): Skin to skin;Teach feeding cues;Waking techniques  Audible Swallowing: None  Type of Nipple: Everted at rest and after stimulation  Comfort (Breast/Nipple): Soft / non-tender     Hold (Positioning): Assistance needed to correctly position infant at breast and maintain latch. Intervention(s): Breastfeeding basics reviewed;Support Pillows;Position options;Skin to skin  LATCH Score: 5  Lactation Tools Discussed/Used Tools: Pump Breast pump type: Double-Electric Breast Pump WIC Program: No (mom a cone emplyee and has a dep at home) Pump Review: Setup, frequency, and cleaning;Milk Storage;Other (comment) (hand expression, teaching from nicu book on ebm) Initiated by:: bedside nurse  within 6 hour of delivery Date initiated:: 02/14/13   Consult Status Consult Status: Follow-up Date:  02/16/13 Follow-up type: In-patient    Alfred Levins 02/15/2013, 6:48 PM

## 2013-02-15 NOTE — Lactation Note (Signed)
This note was copied from the chart of Barbara [facility]. Lactation Consultation Note    Initial consult with this mom of a NICU baby, now 38 3/[redacted] weeks gestation, and 19 hours  Post partum. Mom has been pumping every 3 hours with DEP, and is expressing about 2-3 ml's of colostrum. I reviewed hand expression with mom,and she demonstrated great technique.  I did teaching from the nICU booklet on how to provide EBm for a NICU baby. Lactation services reviewed with mom. She knows to call for questions/concerns. i am going to assist her with latching her baby in the NICU for the 6pm feeding.  Patient Name: Barbara Scott [id number] Date: 02/15/2013 Reason for consult: Initial assessment;NICU baby   Maternal Data Formula Feeding for Exclusion: Yes (baby in nicu) Infant to breast within first hour of birth: No Breastfeeding delayed due to:: Infant status Has patient been taught Hand Expression?: Yes Does the patient have breastfeeding experience prior to this delivery?: No  Feeding Feeding Type: Formula Nipple Type: Slow - flow  LATCH Score/Interventions                      Lactation Tools Discussed/Used Tools: Pump Breast pump type: Double-Electric Breast Pump WIC Program: No (mom a cone [employer] and has a dep at home) Pump Review: Setup, frequency, and cleaning;Milk Storage;Other (comment) (hand expression, teaching from nicu book on ebm) Initiated by:: bedside nurse  within 6 hour of delivery Date initiated:: 02/14/13   Consult Status Consult Status: Follow-up Date: 02/16/13 Follow-up type: In-patient    Alfred Levins 02/15/2013, 5:36 PM

## 2013-02-15 NOTE — Anesthesia Postprocedure Evaluation (Signed)
  Anesthesia Post Note  Patient: Barbara Scott  Procedure(s) Performed: * No procedures listed *  Anesthesia type: Epidural  Patient location: AICU  Post pain: Pain level controlled  Post assessment: Post-op Vital signs reviewed  Last Vitals:  Filed Vitals:   02/15/13 0800  BP: 125/85  Pulse: 82  Temp: 36.9 C  Resp: 18    Post vital signs: Reviewed  Level of consciousness:alert  Complications: No apparent anesthesia complications

## 2013-02-15 NOTE — Progress Notes (Signed)
Ur chart review completed.  

## 2013-02-16 NOTE — Lactation Note (Signed)
This note was copied from the chart of Barbara Aiyla Baucom. Lactation Consultation Note    Follow up consult with this mom and baby, in NICU, now 40 hours post partum. Mom is expressing good amounts of colostrum, and is beginning to transitioning to mature milk. Her breast are fuller today.  The baby was placed skin to skin with mom, but needed a few minutes to calm - very agitated - possibly has some discomfort from shoulder dystocia at birth. Once he settled, with my help, mom was able to latche the baby, but he suckled only for 30-60 seconds, and then fell asleep. Mom held baby skin to skin, and later fed a bottrle of formula to the baby. I will continue to work with this family in the nICU.  Patient Name: Barbara Scott [id number] Date: 02/16/2013 Reason for consult: Follow-up assessment;NICU baby   Maternal Data    Feeding Feeding Type: Formula (and breast) Nipple Type: Regular Length of feed: 20 min  LATCH Score/Interventions Latch: Repeated attempts needed to sustain latch, nipple held in mouth throughout feeding, stimulation needed to elicit sucking reflex. Intervention(s): Skin to skin;Teach feeding cues;Waking techniques Intervention(s): Adjust position;Assist with latch;Breast massage;Breast compression  Audible Swallowing: None Intervention(s): Skin to skin;Hand expression  Type of Nipple: Flat (semi flat - cone shped breast and nipple) Intervention(s): Double electric pump  Comfort (Breast/Nipple): Soft / non-tender     Hold (Positioning): Assistance needed to correctly position infant at breast and maintain latch. Intervention(s): Breastfeeding basics reviewed;Support Pillows;Position options;Skin to skin  LATCH Score: 5  Lactation Tools Discussed/Used     Consult Status Consult Status: Follow-up Date: 02/17/13 Follow-up type: In-patient    Barbara Scott 02/16/2013, 3:22 PM

## 2013-02-16 NOTE — Progress Notes (Signed)
Post Partum Day 2 Subjective: up ad lib, voiding, tolerating PO and complains of perineal edema and questionable fluid drainage from rectum. Baby stable in NICU  Objective: Blood pressure 145/77, pulse 73, temperature 98.2 F (36.8 C), temperature source Oral, resp. rate 16, height 5\' 5"  (1.651 m), weight 210 lb 4 oz (95.369 kg), SpO2 99.00%, unknown if currently breastfeeding.  Physical Exam:  General: alert and cooperative Lochia: appropriate Uterine Fundus: firm Incision: slow healing, labial edema and ecchymosis noted. Rectal exam performed by Dr. Henderson Cloud with intact mucosa and no hematoma ebserved DVT Evaluation: No evidence of DVT seen on physical exam. Negative Homan's sign. No cords or calf tenderness. Calf/Ankle edema is present.   Recent Labs  02/14/13 2345 02/15/13 0524  HGB 11.0* 9.8*  HCT 32.7* 29.4*    Assessment/Plan: Plan for discharge tomorrow and Circumcision prior to discharge Sitz bath  LOS: 2 days   Barbara Scott G 02/16/2013, 9:04 AM

## 2013-02-17 LAB — COMPREHENSIVE METABOLIC PANEL
CO2: 27 mEq/L (ref 19–32)
Calcium: 9.2 mg/dL (ref 8.4–10.5)
Chloride: 103 mEq/L (ref 96–112)
Creatinine, Ser: 0.74 mg/dL (ref 0.50–1.10)
GFR calc Af Amer: >90 mL/min (ref >90)
GFR calc non Af Amer: >90 mL/min (ref >90)
Glucose, Bld: 78 mg/dL (ref 70–99)
Potassium: 4.4 mEq/L (ref 3.5–5.1)
Sodium: 138 mEq/L (ref 135–145)
Total Bilirubin: 0.3 mg/dL (ref 0.3–1.2)

## 2013-02-17 LAB — CBC
HCT: 31.3 % — ABNORMAL LOW (ref 36.0–46.0)
Hemoglobin: 10.3 g/dL — ABNORMAL LOW (ref 12.0–15.0)
MCH: 28.3 pg (ref 26.0–34.0)
MCV: 86 fL (ref 78.0–100.0)
RBC: 3.64 MIL/uL — ABNORMAL LOW (ref 3.87–5.11)

## 2013-02-17 NOTE — Progress Notes (Signed)
Post Partum Day 3 Subjective: up ad lib, voiding, tolerating PO and denies HA, blurred vision or RUQ pain  Objective: Blood pressure 151/95, pulse 93, temperature 99.6 F (37.6 C), temperature source Oral, resp. rate 15, height 5\' 5"  (1.651 m), weight 209 lb 4 oz (94.915 kg), SpO2 96.00%, unknown if currently breastfeeding.  Physical Exam:  General: alert and cooperative Lochia: appropriate Uterine Fundus: firm Incision: labial edema and swelling continue,however improving, ecchymosis noted on l labia DVT Evaluation: No evidence of DVT seen on physical exam. Negative Homan's sign. No cords or calf tenderness. Calf/Ankle edema is present.   Recent Labs  02/14/13 2345 02/15/13 0524  HGB 11.0* 9.8*  HCT 32.7* 29.4*    Assessment/Plan: Plan for discharge tomorrow   LOS: 3 days   CURTIS,CAROL G 02/17/2013, 8:46 AM

## 2013-02-17 NOTE — Lactation Note (Signed)
This note was copied from the chart of Barbara Geralene Afshar. Lactation Consultation Note    Follow up consult with this mom of a term baby, in the NICU, now 67 hours post aprtum. Mom is still expressing about 15 mls every 3 hours, and the baby is feeding about 65 mls of formula  At a time. Mom wants to exclusively breast feed when she goes home. The baby and mom should be discharged home together tomorrow. I gave mom an SNS set and instructed her in it's use. She has heard of this from friends who have used this. I  Feel the SNS would be a way to transition the baby to breast feeding, while still getting the volume he is used to. I told mom to have baby's nurse call for me to help with SNS if he feeds before I leave today, or to see if lactation would be able to help her tomorrow, prior to their discharge. Mom also knows to call for questions/concerns or o/p consults as needed.I also told mom to continue pumping at least 4-6 times a day, along with breast feeding, until her supply is suficient to feed the baby.  Patient Name: Barbara Scott [id number] Date: 02/17/2013 Reason for consult: Follow-up assessment;NICU baby   Maternal Data    Feeding Feeding Type: Breast Milk with Formula added Nipple Type: Regular Length of feed: 25 min  LATCH Score/Interventions                      Lactation Tools Discussed/Used     Consult Status Consult Status: Follow-up Date: 02/18/13 Follow-up type: In-patient    Alfred Levins 02/17/2013, 5:39 PM

## 2013-02-17 NOTE — Lactation Note (Signed)
This note was copied from the chart of Barbara Scott. Lactation Consultation Note     Follow up brief consult with this mom of a term baby, now 61 hours post partum. Mom reports the baby latched ans suckled briefly this morning. She is pumping every 3 hours, and expressing about 15 mls . I advised mom to stay hydrated, and continue with hand expressing after each pumping. Mom will call if for me to come and assist. baby latches again in the nICU,  Patient Name: Barbara Scott [id number] Date: 02/17/2013     Maternal Data    Feeding Feeding Type: Breast Milk with Formula added Nipple Type: Regular Length of feed: 25 min  LATCH Score/Interventions                      Lactation Tools Discussed/Used     Consult Status      Alfred Levins 02/17/2013, 5:35 PM

## 2013-02-18 LAB — CBC
Hemoglobin: 9.5 g/dL — ABNORMAL LOW (ref 12.0–15.0)
MCHC: 33.7 g/dL (ref 30.0–36.0)
Platelets: 231 10*3/uL (ref 150–400)
RBC: 3.31 MIL/uL — ABNORMAL LOW (ref 3.87–5.11)
WBC: 9.9 10*3/uL (ref 4.0–10.5)

## 2013-02-18 LAB — COMPREHENSIVE METABOLIC PANEL
ALT: 28 U/L (ref 0–35)
AST: 53 U/L — ABNORMAL HIGH (ref 0–37)
Alkaline Phosphatase: 263 U/L — ABNORMAL HIGH (ref 39–117)
CO2: 27 mEq/L (ref 19–32)
GFR calc Af Amer: >90 mL/min (ref >90)
GFR calc non Af Amer: >90 mL/min (ref >90)
Glucose, Bld: 80 mg/dL (ref 70–99)
Potassium: 4 mEq/L (ref 3.5–5.1)
Sodium: 138 mEq/L (ref 135–145)
Total Protein: 4.7 g/dL — ABNORMAL LOW (ref 6.0–8.3)

## 2013-02-18 MED ORDER — IBUPROFEN 800 MG PO TABS: 800.0000 mg | ORAL_TABLET | Freq: Three times a day (TID) | ORAL | Status: DC | PRN

## 2013-02-18 MED ORDER — OXYCODONE-ACETAMINOPHEN 5-325 MG PO TABS: 1.0000 | ORAL_TABLET | Freq: Four times a day (QID) | ORAL | Status: DC | PRN

## 2013-02-18 NOTE — Discharge Summary (Signed)
Obstetric Discharge Summary Reason for Admission: induction of labor Prenatal Procedures: none Intrapartum Procedures: spontaneous vaginal delivery Postpartum Procedures: none Complications-Operative and Postpartum: shoulder dystocia Hemoglobin  Date Value Range Status  02/18/2013 9.5* 12.0 - 15.0 g/dL Final     HCT  Date Value Range Status  02/18/2013 28.2* 36.0 - 46.0 % Final    Physical Exam:  General: alert, cooperative and appears stated age Lochia: appropriate Uterine Fundus: firm Incision: healing well, no significant drainage, no dehiscence, edema present but improved DVT Evaluation: No evidence of DVT seen on physical exam. Negative Homan's sign. No cords or calf tenderness.  Discharge Diagnoses: Term Pregnancy-delivered  Discharge Information: Date: 02/18/2013 Activity: pelvic rest Diet: routine Medications: PNV, Ibuprofen and Percocet Condition: stable Instructions: refer to practice specific booklet Discharge to: home   Newborn Data: Live born female  Birth Weight: 9 lb 6.3 oz (4260 g) APGAR: 6, 7  Home with mother.  Delphia Kaylor 02/18/2013, 12:20 PM

## 2013-02-18 NOTE — Progress Notes (Signed)
Discharge instructions provided to patient and significant other at bedside.  Activity, medications, follow up appointments, when to call the doctor and community resources discussed.  No questions at this time.  Patient left unit in stable condition with all personal belongings and prescriptions accompanied by staff.  K. Carri Spillers, RN------------------------   

## 2013-02-18 NOTE — Progress Notes (Signed)
Post Partum Day 4 Subjective: no complaints, up ad lib, voiding and tolerating PO.  Patient requests circumcision.  No HA, CP/SOB, RUQ pain, vision change.  Objective: Blood pressure 134/85, pulse 86, temperature 98.5 F (36.9 C), temperature source Oral, resp. rate 16, height 5\' 5"  (1.651 m), weight 207 lb (93.895 kg), SpO2 98.00%, unknown if currently breastfeeding.  Physical Exam:  General: alert, cooperative and appears stated age Lochia: appropriate Uterine Fundus: firm Incision: healing well, no significant drainage, no dehiscence, edema still present but improved. DVT Evaluation: No evidence of DVT seen on physical exam. Negative Homan's sign. No cords or calf tenderness.   Recent Labs  02/17/13 0917 02/18/13 0503  HGB 10.3* 9.5*  HCT 31.3* 28.2*    Assessment/Plan: Discharge home, Breastfeeding and Circumcision prior to discharge Counseled for circ including risk of bleeding, infection and scarring F/U in office in 1 week for BP check and HELLP labs   LOS: 4 days   Martine Trageser 02/18/2013, 12:14 PM

## 2013-02-20 ENCOUNTER — Inpatient Hospital Stay (HOSPITAL_COMMUNITY): Admission: RE | Admit: 2013-02-20 | Payer: 59 | Source: Ambulatory Visit

## 2013-11-23 ENCOUNTER — Encounter (INDEPENDENT_AMBULATORY_CARE_PROVIDER_SITE_OTHER): Payer: Self-pay | Admitting: Surgery

## 2013-11-23 ENCOUNTER — Ambulatory Visit (INDEPENDENT_AMBULATORY_CARE_PROVIDER_SITE_OTHER): Payer: 59 | Admitting: Surgery

## 2013-11-23 VITALS — BP 120/74 | HR 74 | Temp 98.0°F | Resp 18 | Ht 65.0 in | Wt 187.0 lb

## 2013-11-23 DIAGNOSIS — D225 Melanocytic nevi of trunk: Secondary | ICD-10-CM | POA: Insufficient documentation

## 2013-11-23 DIAGNOSIS — D235 Other benign neoplasm of skin of trunk: Secondary | ICD-10-CM

## 2013-11-23 NOTE — Progress Notes (Signed)
Re:   Barbara Scott DOB:   [birth year] MRN:   [id number]  ASSESSMENT AND PLAN: 1.  Multiple nevi  Will plan excision of about 6 lesions on chest, right axilla, and abdomen  Will plan about 6 lesions to excise on back  She understands the risks and complications  [facility] will only pay for 5 lesions at a time.  I talked to her on the phone about this.  So I would take off the lesion on her chest, right axilla, and abdomen for now.  DN 11/23/2013]  2.  History of migraine headaches  Chief Complaint  Patient presents with  . New Evaluation    mole axillia area    REFERRING PHYSICIAN: No primary provider on file.  HISTORY OF PRESENT ILLNESS: Barbara Scott is a 28 y.o. (DOB: [birth year])  white  female whose primary care physician is No primary provider on file. and comes to me today for multiple moles. Her mother had melanoma Dr. Margot Chimes excised multiple dysplastic nevi in 08/2004. She has been seen someone in [place] for dermatology.  She has had multiple excisions in the past.   Past Medical History  Diagnosis Date  . Headache(784.0)     migraines  . Abnormal Pap smear     ASCUS     Past Surgical History  Procedure Laterality Date  . Tonsillectomy    . Eye surgery    . Knee arthroscopy    . Cholesteatoma excision    . Cholecystectomy    . Melanoma excision        Current Outpatient Prescriptions  Medication Sig Dispense Refill  . Prenatal Vit-Fe Fumarate-FA (PRENATAL MULTIVITAMIN) TABS tablet Take 1 tablet by mouth daily at 12 noon.      Marland Kitchen ibuprofen (ADVIL,MOTRIN) 800 MG tablet Take 1 tablet (800 mg total) by mouth every 8 (eight) hours as needed for moderate pain.  30 tablet  0  . oxyCODONE-acetaminophen (PERCOCET/ROXICET) 5-325 MG per tablet Take 1-2 tablets by mouth every 6 (six) hours as needed for moderate pain.  20 tablet  0   No current facility-administered medications for this visit.      Allergies  Allergen Reactions  . Bee Venom   . Shrimp [Shellfish  Allergy] Swelling  . Vicodin [Hydrocodone-Acetaminophen] Itching    REVIEW OF SYSTEMS: Skin:  Has had dysplastic nevi.  Fam hx of melanoma Infection:   No history of MRSA. Neurologic:  No history of stroke.  No history of seizure.  No history of headaches. Cardiac:  No history of hypertension. No history of heart disease.  No history of prior cardiac catheterization.  No history of seeing a cardiologist. Pulmonary:  Does not smoke cigarettes.  No asthma or bronchitis.  No OSA/CPAP.  Endocrine:  No diabetes. No thyroid disease. Gastrointestinal:  No history of stomach disease.  No history of liver disease.  No history of gall bladder disease.  No history of pancreas disease.  No history of colon disease. Urologic:  No history of kidney stones.  No history of bladder infections. Musculoskeletal:  No history of joint or back disease. Hematologic:  No bleeding disorder.  No history of anemia.  Not anticoagulated. Psycho-social:  The patient is oriented.   The patient has no obvious psychologic or social impairment to understanding our conversation and plan.  SOCIAL and FAMILY HISTORY: Married. Works as an Haematologist at [employer] Her insurance is through her husbands. Her mother in law is [place]: BP  120/74  Pulse 74  Temp(Src) 98 F (36.7 C)  Resp 18  Ht 5\' 5"  (1.651 m)  Wt 187 lb (84.823 kg)  BMI 31.12 kg/m2  General: WN younger WF who is alert and generally healthy appearing.  HEENT: Normal. Pupils equal. Neck: Supple. No mass.  No thyroid mass. Skin:  Multiple moles of right axilla, chest, abdomen, back  Each about 0.5 cm in size.  I have counted at least 12 moles that may need to be removed. Abdomen: Soft. No mass.  Extremities:  Good strength and ROM  in upper and lower extremities. Neurologic:  Grossly intact to motor and sensory function. Psychiatric: Has normal mood and affect.    Back with multiple moles  Right axilla, chest, and abdomen  with multiple moles.   DATA REVIEWED: Epic noes  Alphonsa Overall, MD,  [facility], [address]  [place], [place]    [place] Phone:  [place]:  [phone]

## 2013-11-28 ENCOUNTER — Encounter (INDEPENDENT_AMBULATORY_CARE_PROVIDER_SITE_OTHER): Payer: Self-pay | Admitting: Surgery

## 2013-12-31 NOTE — H&P (Signed)
[facility]  DICTATION # [id number] CSN# [id number]   Margarette Asal, MD 12/31/2013 8:23 AM

## 2014-01-01 ENCOUNTER — Ambulatory Visit (HOSPITAL_COMMUNITY)
Admission: RE | Admit: 2014-01-01 | Discharge: 2014-01-01 | Disposition: A | Discharge status: Home / Self Care | Payer: 59 | Source: Ambulatory Visit | Attending: Obstetrics and Gynecology | Admitting: Obstetrics and Gynecology

## 2014-01-01 ENCOUNTER — Encounter (HOSPITAL_COMMUNITY): Payer: 59 | Admitting: Anesthesiology

## 2014-01-01 ENCOUNTER — Encounter (HOSPITAL_COMMUNITY): Payer: Self-pay | Admitting: Anesthesiology

## 2014-01-01 ENCOUNTER — Ambulatory Visit (HOSPITAL_COMMUNITY): Payer: 59 | Admitting: Anesthesiology

## 2014-01-01 ENCOUNTER — Encounter (HOSPITAL_COMMUNITY)
Admission: RE | Disposition: A | Discharge status: Home / Self Care | Payer: Self-pay | Source: Ambulatory Visit | Attending: Obstetrics and Gynecology

## 2014-01-01 DIAGNOSIS — O021 Missed abortion: Secondary | ICD-10-CM | POA: Diagnosis not present

## 2014-01-01 HISTORY — PX: DILATION AND EVACUATION: SHX1459

## 2014-01-01 SURGERY — DILATION AND EVACUATION, UTERUS
Anesthesia: Monitor Anesthesia Care | Site: Vagina

## 2014-01-01 MED ORDER — LIDOCAINE HCL 1 % IJ SOLN
INTRAMUSCULAR | Status: AC
  Filled 2014-01-01: qty 20

## 2014-01-01 MED ORDER — FENTANYL CITRATE 0.05 MG/ML IJ SOLN: 25.0000 ug | INTRAMUSCULAR | Status: DC | PRN

## 2014-01-01 MED ORDER — KETOROLAC TROMETHAMINE 30 MG/ML IJ SOLN
INTRAMUSCULAR | Status: DC | PRN
  Administered 2014-01-01: 30 mg via INTRAVENOUS

## 2014-01-01 MED ORDER — SILVER NITRATE-POT NITRATE 75-25 % EX MISC
CUTANEOUS | Status: DC | PRN
  Administered 2014-01-01: 1 via TOPICAL

## 2014-01-01 MED ORDER — DEXAMETHASONE SODIUM PHOSPHATE 10 MG/ML IJ SOLN
INTRAMUSCULAR | Status: DC | PRN
  Administered 2014-01-01: 4 mg via INTRAVENOUS

## 2014-01-01 MED ORDER — ONDANSETRON HCL 4 MG/2ML IJ SOLN
INTRAMUSCULAR | Status: AC
  Filled 2014-01-01: qty 2

## 2014-01-01 MED ORDER — PROPOFOL 10 MG/ML IV EMUL
INTRAVENOUS | Status: DC | PRN
  Administered 2014-01-01 (×5): 20 mg via INTRAVENOUS
  Administered 2014-01-01: 30 mg via INTRAVENOUS

## 2014-01-01 MED ORDER — SCOPOLAMINE 1 MG/3DAYS TD PT72
MEDICATED_PATCH | TRANSDERMAL | Status: AC
  Administered 2014-01-01: 1.5 mg via TRANSDERMAL
  Filled 2014-01-01: qty 1

## 2014-01-01 MED ORDER — MIDAZOLAM HCL 2 MG/2ML IJ SOLN
INTRAMUSCULAR | Status: DC | PRN
  Administered 2014-01-01: 2 mg via INTRAVENOUS

## 2014-01-01 MED ORDER — ONDANSETRON HCL 4 MG/2ML IJ SOLN
INTRAMUSCULAR | Status: DC | PRN
  Administered 2014-01-01: 4 mg via INTRAVENOUS

## 2014-01-01 MED ORDER — FENTANYL CITRATE 0.05 MG/ML IJ SOLN
INTRAMUSCULAR | Status: DC | PRN
  Administered 2014-01-01 (×2): 50 ug via INTRAVENOUS

## 2014-01-01 MED ORDER — SCOPOLAMINE 1 MG/3DAYS TD PT72
1.0000 | MEDICATED_PATCH | Freq: Once | TRANSDERMAL | Status: DC
  Administered 2014-01-01: 1.5 mg via TRANSDERMAL

## 2014-01-01 MED ORDER — KETOROLAC TROMETHAMINE 30 MG/ML IJ SOLN: 15.0000 mg | Freq: Once | INTRAMUSCULAR | Status: DC | PRN

## 2014-01-01 MED ORDER — LIDOCAINE HCL 1 % IJ SOLN
INTRAMUSCULAR | Status: DC | PRN
  Administered 2014-01-01: 10 mL

## 2014-01-01 MED ORDER — PROMETHAZINE HCL 25 MG/ML IJ SOLN: 6.2500 mg | INTRAMUSCULAR | Status: DC | PRN

## 2014-01-01 MED ORDER — LIDOCAINE HCL (CARDIAC) 20 MG/ML IV SOLN
INTRAVENOUS | Status: DC | PRN
  Administered 2014-01-01 (×2): 30 mg via INTRAVENOUS

## 2014-01-01 MED ORDER — MEPERIDINE HCL 25 MG/ML IJ SOLN: 6.2500 mg | INTRAMUSCULAR | Status: DC | PRN

## 2014-01-01 MED ORDER — MIDAZOLAM HCL 2 MG/2ML IJ SOLN
INTRAMUSCULAR | Status: AC
  Filled 2014-01-01: qty 2

## 2014-01-01 MED ORDER — FENTANYL CITRATE 0.05 MG/ML IJ SOLN
INTRAMUSCULAR | Status: AC
  Filled 2014-01-01: qty 2

## 2014-01-01 MED ORDER — LACTATED RINGERS IV SOLN
INTRAVENOUS | Status: DC
  Administered 2014-01-01 (×2): via INTRAVENOUS

## 2014-01-01 SURGICAL SUPPLY — 16 items
CATH ROBINSON RED A/P 16FR (CATHETERS) ×2 IMPLANT
CLOTH BEACON ORANGE TIMEOUT ST (SAFETY) ×2 IMPLANT
DECANTER SPIKE VIAL GLASS SM (MISCELLANEOUS) ×2 IMPLANT
GLOVE BIO SURGEON STRL SZ7 (GLOVE) ×4 IMPLANT
GOWN STRL REUS W/TWL LRG LVL3 (GOWN DISPOSABLE) ×4 IMPLANT
KIT BERKELEY 1ST TRIMESTER 3/8 (MISCELLANEOUS) ×2 IMPLANT
NS IRRIG 1000ML POUR BTL (IV SOLUTION) ×2 IMPLANT
PACK VAGINAL MINOR WOMEN LF (CUSTOM PROCEDURE TRAY) ×2 IMPLANT
PAD OB MATERNITY 4.3X12.25 (PERSONAL CARE ITEMS) ×2 IMPLANT
PAD PREP 24X48 CUFFED NSTRL (MISCELLANEOUS) ×2 IMPLANT
SET BERKELEY SUCTION TUBING (SUCTIONS) ×2 IMPLANT
TOWEL OR 17X24 6PK STRL BLUE (TOWEL DISPOSABLE) ×4 IMPLANT
VACURETTE 10 RIGID CVD (CANNULA) IMPLANT
VACURETTE 7MM CVD STRL WRAP (CANNULA) ×1 IMPLANT
VACURETTE 8 RIGID CVD (CANNULA) IMPLANT
VACURETTE 9 RIGID CVD (CANNULA) IMPLANT

## 2014-01-01 NOTE — Transfer of Care (Signed)
Immediate Anesthesia Transfer of Care Note  Patient: Barbara Scott  Procedure(s) Performed: Procedure(s): DILATATION AND EVACUATION (N/A)  Patient Location: PACU  Anesthesia Type:MAC  Level of Consciousness: awake, alert , oriented and patient cooperative  Airway & Oxygen Therapy: Patient Spontanous Breathing  Post-op Assessment: Report given to PACU RN and Post -op Vital signs reviewed and stable  Post vital signs: Reviewed and stable  Complications: No apparent anesthesia complications

## 2014-01-01 NOTE — Anesthesia Preprocedure Evaluation (Addendum)
Anesthesia Evaluation  Patient identified by MRN, date of birth, ID band Patient awake    Reviewed: Allergy & Precautions, H&P , NPO status , Patient's Chart, lab work & pertinent test results, reviewed documented beta blocker date and time   History of Anesthesia Complications Negative for: history of anesthetic complications  Airway Mallampati: I TM Distance: >3 FB Neck ROM: full    Dental  (+) Teeth Intact   Pulmonary neg pulmonary ROS,  breath sounds clear to auscultation        Cardiovascular negative cardio ROS  Rhythm:regular Rate:Normal     Neuro/Psych  Headaches (migraines), negative neurological ROS  negative psych ROS   GI/Hepatic negative GI ROS, Neg liver ROS,   Endo/Other  negative endocrine ROS  Renal/GU negative Renal ROS     Musculoskeletal   Abdominal   Peds  Hematology negative hematology ROS (+)   Anesthesia Other Findings   Reproductive/Obstetrics (+) Pregnancy (missed ab)                           Anesthesia Physical Anesthesia Plan  ASA: II  Anesthesia Plan: MAC   Post-op Pain Management:    Induction:   Airway Management Planned:   Additional Equipment:   Intra-op Plan:   Post-operative Plan:   Informed Consent: I have reviewed the patients History and Physical, chart, labs and discussed the procedure including the risks, benefits and alternatives for the proposed anesthesia with the patient or authorized representative who has indicated his/her understanding and acceptance.     Plan Discussed with: CRNA and Surgeon  Anesthesia Plan Comments:         Anesthesia Quick Evaluation

## 2014-01-01 NOTE — Discharge Instructions (Signed)
DISCHARGE INSTRUCTIONS: D&C / D&E The following instructions have been prepared to help you care for yourself upon your return home.   **You may take additional ibuprofen products after 7:16 pm**  Personal hygiene:  Use sanitary pads for vaginal drainage, not tampons.  Shower the day after your procedure.  NO tub baths, pools or Jacuzzis for 2-3 weeks.  Wipe front to back after using the bathroom.  Activity and limitations:  Do NOT drive or operate any equipment for 24 hours. The effects of anesthesia are still present and drowsiness may result.  Do NOT rest in bed all day.  Walking is encouraged.  Walk up and down stairs slowly.  You may resume your normal activity in one to two days or as indicated by your physician.  Sexual activity: NO intercourse for at least 2 weeks after the procedure, or as indicated by your physician.  Diet: Eat a light meal as desired this evening. You may resume your usual diet tomorrow.  Return to work: You may resume your work activities in one to two days or as indicated by your doctor.  What to expect after your surgery: Expect to have vaginal bleeding/discharge for 2-3 days and spotting for up to 10 days. It is not unusual to have soreness for up to 1-2 weeks. You may have a slight burning sensation when you urinate for the first day. Mild cramps may continue for a couple of days. You may have a regular period in 2-6 weeks.  Call your doctor for any of the following:  Excessive vaginal bleeding, saturating and changing one pad every hour.  Inability to urinate 6 hours after discharge from hospital.  Pain not relieved by pain medication.  Fever of 100.4 F or greater.  Unusual vaginal discharge or odor.   Call for an appointment:    Patients signature: ______________________  Nurses signature ________________________  Support person's signature_______________________

## 2014-01-01 NOTE — Progress Notes (Signed)
The patient was re-examined with no change in status 

## 2014-01-01 NOTE — Anesthesia Postprocedure Evaluation (Signed)
  Anesthesia Post-op Note  Patient: Barbara Scott  Procedure(s) Performed: Procedure(s): DILATATION AND EVACUATION (N/A)  Patient Location: PACU  Anesthesia Type:MAC  Level of Consciousness: awake, alert  and oriented  Airway and Oxygen Therapy: Patient Spontanous Breathing  Post-op Pain: none  Post-op Assessment: Post-op Vital signs reviewed, Patient's Cardiovascular Status Stable, Respiratory Function Stable, Patent Airway, No signs of Nausea or vomiting and Pain level controlled  Post-op Vital Signs: Reviewed and stable  Last Vitals:  Filed Vitals:   01/01/14 1400  BP:   Pulse: 80  Temp: 36.9 C  Resp: 18    Complications: No apparent anesthesia complications

## 2014-01-01 NOTE — Op Note (Signed)
Preoperative diagnosis: Missed AB  Postoperative diagnosis: Same  Procedure: D&E  Surgeon: Matthew Saras  Anesthesia: MAC  EBL: Less than 50 cc  Procedure and findings:  Patient taken the operating room after an adequate level of sedation was obtained with the legs in stirrups the perineum and vagina were prepped and draped in the usual fashion. The bladder was drained, EUA carried out, uterus was 7-8 weeks size, mid position, adnexa negative. Appropriate timeout for taken prior to the exam. Speculum was positioned cervix grasped with tenaculum paracervical block was then carried by infiltrating at 3 and 9:00 submucosally, 5-7 cc of 1% plain Xylocaine at each site after negative aspiration. The uterus is then sounded to 8 cm, progressively dilated to a 27 Pratt dilator. A 7 mm curved curet was then used to curette a moderate amount of tissue when no further tissue could be removed a small blunt curette was used to explore the cavity revealing it to be clean. She tolerated this well, went to recovery room in good condition  Dictated with [facility]  Jyron Turman M. Garry Heater.D.

## 2014-01-02 ENCOUNTER — Encounter (HOSPITAL_COMMUNITY): Payer: Self-pay | Admitting: Obstetrics and Gynecology

## 2014-01-02 NOTE — H&P (Signed)
NAMECYNITHA, BERTE NO.:  [id number]  MEDICAL RECORD NO.:  [id number]  LOCATION:                                 FACILITY:  PHYSICIAN:  Ralene Bathe. Matthew Saras, M.D.DATE OF BIRTH:  [birth year]  DATE OF ADMISSION:  01/01/2014 DATE OF DISCHARGE:                             HISTORY & PHYSICAL   CHIEF COMPLAINT:  Missed AB.  HISTORY OF PRESENT ILLNESS:  A 28 year old G2, P1 who presents with her second pregnancy, ultrasound done 2 weeks prior to this admission demonstrating early pregnancy sac, followup ultrasound 10 days later showed a flattened flap with no evidence of fetal pole consistent with missed AB.  Presents now for D and E.  This procedure including specific risks related to bleeding, infection, other complications such as perforation that may require additional surgery all reviewed with her which she understands and accepts.  Her blood type is O positive.  PAST MEDICAL HISTORY:  Allergies to Vicodin __________.  Obstetrical history, one vaginal delivery at term.  For the remainder of her past medical history, please see the [place] form.  PHYSICAL EXAMINATION:  VITAL SIGNS:  Temp 98.2, blood pressure 120/72. HEENT:  Unremarkable. NECK:  Supple without masses. LUNGS:  Clear. CARDIOVASCULAR:  Regular rate and rhythm without murmurs, rubs, gallops noted. BREASTS:  Without masses. ABDOMEN:  Soft, flat, nontender. PELVIC:  Vulva, vagina, cervix normal.  Cervix was closed.  Uterus was 7 week size, mid position.  Adnexa negative. EXTREMITIES:  Unremarkable. NEUROLOGIC:  Unremarkable.  IMPRESSION:  Missed abortion.  PLAN:  D and E.  Procedure and risks discussed as above.     Maeson Lourenco M. Matthew Saras, M.D.     [facility]/MEDQ  D:  12/31/2013  T:  12/31/2013  Job:  [id number]

## 2014-01-04 DIAGNOSIS — L989 Disorder of the skin and subcutaneous tissue, unspecified: Secondary | ICD-10-CM

## 2014-01-04 HISTORY — DX: Disorder of the skin and subcutaneous tissue, unspecified: L98.9

## 2014-01-26 ENCOUNTER — Encounter (HOSPITAL_BASED_OUTPATIENT_CLINIC_OR_DEPARTMENT_OTHER): Payer: Self-pay | Admitting: *Deleted

## 2014-02-02 ENCOUNTER — Encounter (HOSPITAL_BASED_OUTPATIENT_CLINIC_OR_DEPARTMENT_OTHER): Payer: Self-pay | Admitting: *Deleted

## 2014-02-02 ENCOUNTER — Ambulatory Visit (HOSPITAL_BASED_OUTPATIENT_CLINIC_OR_DEPARTMENT_OTHER)
Admission: RE | Admit: 2014-02-02 | Discharge: 2014-02-02 | Disposition: A | Discharge status: Home / Self Care | Payer: 59 | Source: Ambulatory Visit | Attending: Surgery | Admitting: Surgery

## 2014-02-02 ENCOUNTER — Encounter (HOSPITAL_BASED_OUTPATIENT_CLINIC_OR_DEPARTMENT_OTHER)
Admission: RE | Disposition: A | Discharge status: Home / Self Care | Payer: Self-pay | Source: Ambulatory Visit | Attending: Surgery

## 2014-02-02 DIAGNOSIS — Z91013 Allergy to seafood: Secondary | ICD-10-CM | POA: Diagnosis not present

## 2014-02-02 DIAGNOSIS — Z885 Allergy status to narcotic agent status: Secondary | ICD-10-CM | POA: Insufficient documentation

## 2014-02-02 DIAGNOSIS — Z91030 Bee allergy status: Secondary | ICD-10-CM | POA: Insufficient documentation

## 2014-02-02 DIAGNOSIS — L988 Other specified disorders of the skin and subcutaneous tissue: Secondary | ICD-10-CM | POA: Diagnosis present

## 2014-02-02 DIAGNOSIS — D225 Melanocytic nevi of trunk: Secondary | ICD-10-CM | POA: Diagnosis not present

## 2014-02-02 HISTORY — DX: Disorder of the skin and subcutaneous tissue, unspecified: L98.9

## 2014-02-02 HISTORY — PX: LESION REMOVAL: SHX5196

## 2014-02-02 SURGERY — MINOR EXCISION OF LESION
Anesthesia: LOCAL

## 2014-02-02 MED ORDER — MIDAZOLAM HCL 2 MG/2ML IJ SOLN
INTRAMUSCULAR | Status: AC
  Filled 2014-02-02: qty 2

## 2014-02-02 MED ORDER — SODIUM BICARBONATE 4 % IV SOLN
INTRAVENOUS | Status: AC
Start: 2014-02-02 — End: 2014-02-02
  Filled 2014-02-02: qty 5

## 2014-02-02 MED ORDER — FENTANYL CITRATE 0.05 MG/ML IJ SOLN
INTRAMUSCULAR | Status: AC
  Filled 2014-02-02: qty 6

## 2014-02-02 MED ORDER — LIDOCAINE-EPINEPHRINE (PF) 1 %-1:200000 IJ SOLN
INTRAMUSCULAR | Status: AC
  Filled 2014-02-02: qty 10

## 2014-02-02 SURGICAL SUPPLY — 43 items
APL SKNCLS STERI-STRIP NONHPOA (GAUZE/BANDAGES/DRESSINGS)
BENZOIN TINCTURE PRP APPL 2/3 (GAUZE/BANDAGES/DRESSINGS) IMPLANT
BLADE HEX COATED 2.75 (ELECTRODE) IMPLANT
BLADE SURG 10 STRL SS (BLADE) ×1 IMPLANT
BLADE SURG 15 STRL LF DISP TIS (BLADE) ×1 IMPLANT
BLADE SURG 15 STRL SS (BLADE) ×2
CHLORAPREP W/TINT 26ML (MISCELLANEOUS) ×2 IMPLANT
COVER BACK TABLE 60X90IN (DRAPES) ×1 IMPLANT
COVER MAYO STAND STRL (DRAPES) ×1 IMPLANT
DECANTER SPIKE VIAL GLASS SM (MISCELLANEOUS) IMPLANT
DRAPE PED LAPAROTOMY (DRAPES) ×1 IMPLANT
DRAPE UTILITY XL STRL (DRAPES) ×2 IMPLANT
ELECT COATED BLADE 2.86 ST (ELECTRODE) IMPLANT
ELECT REM PT RETURN 9FT ADLT (ELECTROSURGICAL) ×2
ELECTRODE REM PT RTRN 9FT ADLT (ELECTROSURGICAL) ×1 IMPLANT
GLOVE ECLIPSE 6.5 STRL STRAW (GLOVE) ×1 IMPLANT
GLOVE SURG SIGNA 7.5 PF LTX (GLOVE) ×2 IMPLANT
GOWN STRL REUS W/ TWL LRG LVL3 (GOWN DISPOSABLE) ×2 IMPLANT
GOWN STRL REUS W/TWL LRG LVL3 (GOWN DISPOSABLE)
LIQUID BAND (GAUZE/BANDAGES/DRESSINGS) ×1 IMPLANT
NDL HYPO 25X1 1.5 SAFETY (NEEDLE) ×1 IMPLANT
NEEDLE HYPO 25X1 1.5 SAFETY (NEEDLE) ×2 IMPLANT
NS IRRIG 1000ML POUR BTL (IV SOLUTION) ×2 IMPLANT
PACK BASIN DAY SURGERY FS (CUSTOM PROCEDURE TRAY) ×2 IMPLANT
PENCIL BUTTON HOLSTER BLD 10FT (ELECTRODE) ×2 IMPLANT
SPONGE GAUZE 4X4 12PLY STER LF (GAUZE/BANDAGES/DRESSINGS) ×2 IMPLANT
SPONGE LAP 18X18 X RAY DECT (DISPOSABLE) IMPLANT
STRIP CLOSURE SKIN 1/2X4 (GAUZE/BANDAGES/DRESSINGS) IMPLANT
SUT ETHILON 2 0 FS 18 (SUTURE) IMPLANT
SUT ETHILON 5 0 P 3 18 (SUTURE)
SUT MON AB 5-0 PS2 18 (SUTURE) ×2 IMPLANT
SUT NYLON ETHILON 5-0 P-3 1X18 (SUTURE) IMPLANT
SUT VIC AB 5-0 P-3 18X BRD (SUTURE) IMPLANT
SUT VIC AB 5-0 P3 18 (SUTURE)
SUT VIC AB 5-0 PS2 18 (SUTURE) IMPLANT
SUT VICRYL 3-0 CR8 SH (SUTURE) IMPLANT
SUT VICRYL 4-0 PS2 18IN ABS (SUTURE) IMPLANT
SYR BULB 3OZ (MISCELLANEOUS) ×2 IMPLANT
SYR CONTROL 10ML LL (SYRINGE) ×2 IMPLANT
TOWEL OR 17X24 6PK STRL BLUE (TOWEL DISPOSABLE) ×4 IMPLANT
TOWEL OR NON WOVEN STRL DISP B (DISPOSABLE) ×1 IMPLANT
TUBE CONNECTING 20X1/4 (TUBING) IMPLANT
YANKAUER SUCT BULB TIP NO VENT (SUCTIONS) IMPLANT

## 2014-02-02 NOTE — H&P (Signed)
Re: Barbara Scott  DOB: 12-17-1985  MRN: [id number]   ASSESSMENT AND PLAN:  1. Multiple nevi   Will plan excision of about 6 lesions on chest, right axilla, and abdomen   Will plan about 6 lesions to excise on back   She understands the risks and complications  [facility] will only pay for 5 lesions at a time. I talked to her on the phone about this. So I would take off the lesion on her chest, right axilla, and abdomen for now. DN 11/23/2013]  [We have been in discussions with [facility] and now they will only pay for 3 lesions removed at one time.  DN]  2. History of migraine headaches   Chief Complaint   Patient presents with   .  New Evaluation     mole axillia area   REFERRING PHYSICIAN: No primary provider on file.   HISTORY OF PRESENT ILLNESS:  Barbara Scott is a 28 y.o. (DOB: [birth year]) white female whose primary care physician is No primary provider on file. and comes to me today for multiple moles.   Her mother had melanoma   Dr. Margot Chimes excised multiple dysplastic nevi in 08/2004.   She has been seen someone in [place] for dermatology. She has had multiple excisions in the past.   Past Medical History   Diagnosis  Date   .  Headache(784.0)      migraines   .  Abnormal Pap smear      ASCUS    Past Surgical History   Procedure  Laterality  Date   .  Tonsillectomy     .  Eye surgery     .  Knee arthroscopy     .  Cholesteatoma excision     .  Cholecystectomy     .  Melanoma excision      Current Outpatient Prescriptions   Medication  Sig  Dispense  Refill   .  Prenatal Vit-Fe Fumarate-FA (PRENATAL MULTIVITAMIN) TABS tablet  Take 1 tablet by mouth daily at 12 noon.     Marland Kitchen  ibuprofen (ADVIL,MOTRIN) 800 MG tablet  Take 1 tablet (800 mg total) by mouth every 8 (eight) hours as needed for moderate pain.  30 tablet  0   .  oxyCODONE-acetaminophen (PERCOCET/ROXICET) 5-325 MG per tablet  Take 1-2 tablets by mouth every 6 (six) hours as needed for moderate pain.  20 tablet  0    No  current facility-administered medications for this visit.    Allergies   Allergen  Reactions   .  Bee Venom    .  Shrimp [Shellfish Allergy]  Swelling   .  Vicodin [Hydrocodone-Acetaminophen]  Itching    REVIEW OF SYSTEMS:  Skin: Has had dysplastic nevi. Fam hx of melanoma  Infection: No history of MRSA.  Neurologic: No history of stroke. No history of seizure. No history of headaches.  Cardiac: No history of hypertension. No history of heart disease. No history of prior cardiac catheterization. No history of seeing a cardiologist.  Pulmonary: Does not smoke cigarettes. No asthma or bronchitis. No OSA/CPAP.  Endocrine: No diabetes. No thyroid disease.  Gastrointestinal: No history of stomach disease. No history of liver disease. No history of gall bladder disease. No history of pancreas disease. No history of colon disease.  Urologic: No history of kidney stones. No history of bladder infections.  Musculoskeletal: No history of joint or back disease.  Hematologic: No bleeding disorder. No history of anemia. Not anticoagulated.  Psycho-social: The  patient is oriented. The patient has no obvious psychologic or social impairment to understanding our conversation and plan.   SOCIAL and FAMILY HISTORY:  Married.  Works as an Haematologist at [employer]  Her insurance is through her husbands.  Her mother in law is Nohea Kras   PHYSICAL EXAM:  BP 120/74  Pulse 74  Temp(Src) 98 F (36.7 C)  Resp 18  Ht 5\' 5"  (1.651 m)  Wt 187 lb (84.823 kg)  BMI 31.12 kg/m2   General: WN younger WF who is alert and generally healthy appearing.  HEENT: Normal. Pupils equal.  Neck: Supple. No mass. No thyroid mass.  Skin: Multiple moles of right axilla, chest, abdomen, back  Each about 0.5 cm in size. I have counted at least 12 moles that may need to be removed.  Abdomen: Soft. No mass.  Extremities: Good strength and ROM in upper and lower extremities.  Neurologic: Grossly intact to motor and  sensory function.  Psychiatric: Has normal mood and affect.    Back with multiple moles    Right axilla, chest, and abdomen with multiple moles.   DATA REVIEWED:  Epic noes   Alphonsa Overall, MD, [facility], [address] [place], [place]  Phone: [place]: [phone]

## 2014-02-02 NOTE — Op Note (Signed)
02/02/2014  11:43 AM  PATIENT:  Barbara Scott, 28 y.o., female, MRN: [id number]  PREOP DIAGNOSIS:  3 lesions on chest (over lower sternum), right axilla, and abdomen Just above umbilicus) (0.8 cm, 1.0 cm, and 1.0 cm)  POSTOP DIAGNOSIS:   3 lesions on chest (over lower sternum), right axilla, and abdomen Just above umbilicus) (0.8 cm, 1.0 cm, and 1.0 cm)  PROCEDURE:   Procedure(s): MINOR EXICISION OF LESION X 3 -  RIGHT AXILLA, CHEST, AND ABDOMEN  SURGEON:   Alphonsa Overall, M.D.  ANESTHESIA:   Local  LOCAL MEDICATIONS USED:   15 cc 1% xylocaine  SPECIMEN:   Right axillary, chest, and abdominal skin lesion (three separate specimen)  COUNTS CORRECT:  YES  INDICATIONS FOR PROCEDURE:  Barbara Scott is a 28 y.o. (DOB: November 27, 1985) white  female whose primary care physician is No primary provider on file. and comes for excision of three skin lesions.  She has a history of dysplastic nevi excised in 2006 and a mother who had melanoma.   The indications and risks of the surgery were explained to the patient.  The risks include, but are not limited to, infection, bleeding, and nerve injury.  Procedure:  The patient went to room #4 at [facility].   A time out was held and the surgical check list run.   She had a 1.0 cm mole of her right axilla, a 0.8 cm mole of her chest (over her lower sternal area), and a 1.0 cm mole just above her umbilicus on her abdomen.  I painted all three areas with Chloroprep.  I infiltrated the areas with 15 cc of 1% xylocaine.   I excised each lesion, closed it with 5-0 monocryl, and painted the wound with Dermabond.   The sponge and needle count were correct.  She was taken to recovery room in good condition.  Alphonsa Overall, MD, [facility] Pager: [phone] Office phone:  [phone]

## 2014-02-02 NOTE — Discharge Instructions (Signed)
[facility] - DISCHARGE INSTRUCTIONS TO PATIENT  Activity:  Driving - Okay   Lifting - No limit  Wound Care:   Leave dry for 24 hours  Diet:  As tolerated  Follow up appointment:  Call Dr. Pollie Friar office [facility]) at [phone] for an appointment in 2 to 3 weeks.  Medications and dosages:  Resume your home medications.  Call Dr. Lucia Gaskins or his office  [phone]) if you have:  Temperature greater than 100.4,  Redness, tenderness, or signs of infection (pain, swelling, redness, odor or green/yellow discharge around the site),  Any other questions or concerns you may have after discharge.  In an emergency, call 911 or go to an Emergency Department at a nearby hospital.

## 2014-02-05 ENCOUNTER — Encounter (HOSPITAL_BASED_OUTPATIENT_CLINIC_OR_DEPARTMENT_OTHER): Payer: Self-pay | Admitting: *Deleted

## 2015-01-22 ENCOUNTER — Encounter (HOSPITAL_COMMUNITY): Payer: Self-pay | Admitting: *Deleted

## 2015-01-22 ENCOUNTER — Inpatient Hospital Stay (HOSPITAL_COMMUNITY)
Admission: AD | Admit: 2015-01-22 | Discharge: 2015-01-22 | Disposition: A | Discharge status: Home / Self Care | Payer: Commercial Managed Care - HMO | Source: Ambulatory Visit | Attending: Obstetrics and Gynecology | Admitting: Obstetrics and Gynecology

## 2015-01-22 DIAGNOSIS — Z3A33 33 weeks gestation of pregnancy: Secondary | ICD-10-CM | POA: Insufficient documentation

## 2015-01-22 DIAGNOSIS — R030 Elevated blood-pressure reading, without diagnosis of hypertension: Secondary | ICD-10-CM | POA: Diagnosis present

## 2015-01-22 DIAGNOSIS — O133 Gestational [pregnancy-induced] hypertension without significant proteinuria, third trimester: Secondary | ICD-10-CM | POA: Diagnosis not present

## 2015-01-22 DIAGNOSIS — E876 Hypokalemia: Secondary | ICD-10-CM | POA: Diagnosis not present

## 2015-01-22 HISTORY — DX: Gestational (pregnancy-induced) hypertension without significant proteinuria, unspecified trimester: O13.9

## 2015-01-22 LAB — URINALYSIS, ROUTINE W REFLEX MICROSCOPIC
Bilirubin Urine: NEGATIVE
Glucose, UA: NEGATIVE mg/dL
Ketones, ur: NEGATIVE mg/dL
NITRITE: NEGATIVE
Protein, ur: NEGATIVE mg/dL
SPECIFIC GRAVITY, URINE: 1.01 (ref 1.005–1.030)
UROBILINOGEN UA: 0.2 mg/dL (ref 0.0–1.0)
pH: 7 (ref 5.0–8.0)

## 2015-01-22 LAB — COMPREHENSIVE METABOLIC PANEL
ALT: 10 U/L — AB (ref 14–54)
AST: 16 U/L (ref 15–41)
Albumin: 2.8 g/dL — ABNORMAL LOW (ref 3.5–5.0)
Alkaline Phosphatase: 98 U/L (ref 38–126)
Anion gap: 3 — ABNORMAL LOW (ref 5–15)
CHLORIDE: 111 mmol/L (ref 101–111)
CO2: 24 mmol/L (ref 22–32)
CREATININE: 0.61 mg/dL (ref 0.44–1.00)
Calcium: 8.2 mg/dL — ABNORMAL LOW (ref 8.9–10.3)
GFR calc Af Amer: >60 mL/min (ref >60)
GLUCOSE: 101 mg/dL — AB (ref 65–99)
Potassium: 3.2 mmol/L — ABNORMAL LOW (ref 3.5–5.1)
Sodium: 138 mmol/L (ref 135–145)
Total Bilirubin: 0.3 mg/dL (ref 0.3–1.2)
Total Protein: 6.2 g/dL — ABNORMAL LOW (ref 6.5–8.1)

## 2015-01-22 LAB — CBC
HCT: 31.7 % — ABNORMAL LOW (ref 36.0–46.0)
Hemoglobin: 10.2 g/dL — ABNORMAL LOW (ref 12.0–15.0)
MCH: 27.3 pg (ref 26.0–34.0)
MCHC: 32.2 g/dL (ref 30.0–36.0)
MCV: 85 fL (ref 78.0–100.0)
PLATELETS: 226 10*3/uL (ref 150–400)
RBC: 3.73 MIL/uL — AB (ref 3.87–5.11)
RDW: 14.7 % (ref 11.5–15.5)
WBC: 12.4 10*3/uL — ABNORMAL HIGH (ref 4.0–10.5)

## 2015-01-22 LAB — PROTEIN / CREATININE RATIO, URINE
Creatinine, Urine: 29 mg/dL
Total Protein, Urine: <6 mg/dL

## 2015-01-22 LAB — URINE MICROSCOPIC-ADD ON

## 2015-01-22 MED ORDER — ACETAMINOPHEN 500 MG PO TABS
1000.0000 mg | ORAL_TABLET | Freq: Once | ORAL | Status: AC
  Administered 2015-01-22: 1000 mg via ORAL
  Filled 2015-01-22: qty 2

## 2015-01-22 MED ORDER — POTASSIUM CHLORIDE CRYS ER 20 MEQ PO TBCR
20.0000 meq | EXTENDED_RELEASE_TABLET | Freq: Two times a day (BID) | ORAL | Status: DC
  Administered 2015-01-22: 20 meq via ORAL
  Filled 2015-01-22 (×2): qty 1

## 2015-01-22 NOTE — Discharge Instructions (Signed)
Hypertension During Pregnancy Hypertension is also called high blood pressure. Blood pressure moves blood in your body. Sometimes, the force that moves the blood becomes too strong. When you are pregnant, this condition should be watched carefully. It can cause problems for you and your baby. HOME CARE   Make and keep all of your doctor visits.  Take medicine as told by your doctor. Tell your doctor about all medicines you take.  Eat very little salt.  Exercise regularly.  Do not drink alcohol.  Do not smoke.  Do not have drinks with caffeine.  Lie on your left side when resting.  Your health care provider may ask you to take one low-dose aspirin (81mg ) each day. GET HELP RIGHT AWAY IF:  You have bad belly (abdominal) pain.  You have sudden puffiness (swelling) in the hands, ankles, or face.  You gain 4 pounds (1.8 kilograms) or more in 1 week.  You throw up (vomit) repeatedly.  You have bleeding from the vagina.  You do not feel the baby moving as much.  You have a headache.  You have blurred or double vision.  You have muscle twitching or spasms.  You have shortness of breath.  You have blue fingernails and lips.  You have blood in your pee (urine). MAKE SURE YOU:  Understand these instructions.  Will watch your condition.  Will get help right away if you are not doing well or get worse.   This information is not intended to replace advice given to you by your health care provider. Make sure you discuss any questions you have with your health care provider.   Document Released: 04/25/2010 Document Revised: 04/13/2014 Document Reviewed: 10/20/2012 Elsevier Interactive Patient Education [employer].

## 2015-01-22 NOTE — MAU Provider Note (Signed)
History   [id number]   Chief Complaint  Patient presents with  . Headache  . Hypertension    HPI Barbara Scott is a 29 y.o. female  G2P1001 at [redacted]w[redacted]d IUP came in from home for elevated blood pressures. Reports headache that started this morning.  Reports blood pressures that are starting to increase.  Blood pressure on Friday was 150/90's.   Pt denies vision changes or epigastric pain.  Denies vaginal bleeding, leaking of fluid.  +occasional contractions.  +fetal movement same as typical - "not a lot".   No LMP recorded. Patient is pregnant.  OB History  Gravida Para Term Preterm AB SAB TAB Ectopic Multiple Living  2 1 1       1     # Outcome Date GA Lbr Len/2nd Weight Sex Delivery Anes PTL Lv  2 Current           1 Term 02/14/13 [redacted]w[redacted]d / 01:57 9 lb 6.3 oz (4.26 kg) M Vag-Spont EPI  Y     Comments: LGA, poor perfusion, grunting loudly, subcostal retractions      Past Medical History  Diagnosis Date  . Headache(784.0)     migraines  . Skin lesions 01/2014    chest, right axilla, abd., back  . Pregnancy induced hypertension     Family History  Problem Relation Age of Onset  . Hypertension Mother   . Thyroid disease Mother   . Cancer Mother     melanoma  . Hypertension Maternal Aunt   . Heart attack Maternal Uncle   . Hypertension Maternal Uncle   . Cancer Maternal Grandfather     lung prostate    Social History   Social History  . Marital Status: Married    Spouse Name: N/A  . Number of Children: N/A  . Years of Education: N/A   Social History Main Topics  . Smoking status: Never Smoker   . Smokeless tobacco: Never Used  . Alcohol Use: No  . Drug Use: No  . Sexual Activity: Yes   Other Topics Concern  . None   Social History Narrative    Allergies  Allergen Reactions  . Bee Venom Swelling  . Shrimp [Shellfish Allergy] Swelling    FACIAL SWELLING  . Vicodin [Hydrocodone-Acetaminophen] Itching    No current facility-administered medications on  file prior to encounter.   Current Outpatient Prescriptions on File Prior to Encounter  Medication Sig Dispense Refill  . Prenatal Vit-Fe Fumarate-FA (PRENATAL MULTIVITAMIN) TABS tablet Take 2 tablets by mouth daily at 12 noon.        Review of Systems  Eyes: Negative for visual disturbance.  Gastrointestinal: Negative for abdominal pain.  Genitourinary: Negative for vaginal bleeding, vaginal discharge and pelvic pain.  Neurological: Positive for headaches.  All other systems reviewed and are negative.    Physical Exam   Filed Vitals:   01/22/15 1604 01/22/15 1620 01/22/15 1632 01/22/15 1647  BP: 150/82 150/90 127/72 129/73  Pulse: 88 87 90 89  Temp: 98.7 F (37.1 C)     TempSrc: Oral     Resp: 18      Filed Vitals:   01/22/15 1702 01/22/15 1717 01/22/15 1732 01/22/15 1747  BP: 139/90 147/85 139/74 134/89  Pulse: 89 89 91 84  Temp:      TempSrc:      Resp:         Physical Exam  Constitutional: She is oriented to person, place, and time. She appears well-developed and well-nourished. No  distress.  HENT:  Head: Normocephalic.  Eyes: Pupils are equal, round, and reactive to light.  Neck: Normal range of motion. Neck supple.  Cardiovascular: Normal rate and regular rhythm.   Respiratory: Effort normal and breath sounds normal.  GI: Soft. There is no tenderness.  Genitourinary: No bleeding in the vagina.  Musculoskeletal: Normal range of motion. She exhibits edema (3+ bilat pedal edema ).  Neurological: She is alert and oriented to person, place, and time. She has normal reflexes. She displays normal reflexes.  Skin: Skin is warm and dry.    MAU Course  Procedures  MDM Urinalysis    Component Value Date/Time   COLORURINE STRAW* 01/22/2015 1605   APPEARANCEUR HAZY* 01/22/2015 1605   LABSPEC 1.010 01/22/2015 1605   PHURINE 7.0 01/22/2015 1605   GLUCOSEU NEGATIVE 01/22/2015 1605   HGBUR TRACE* 01/22/2015 1605   BILIRUBINUR NEGATIVE 01/22/2015 1605    KETONESUR NEGATIVE 01/22/2015 1605   PROTEINUR NEGATIVE 01/22/2015 1605   UROBILINOGEN 0.2 01/22/2015 1605   NITRITE NEGATIVE 01/22/2015 1605   LEUKOCYTESUR MODERATE* 01/22/2015 1605    Results for orders placed or performed during the hospital encounter of 01/22/15 (from the past 24 hour(s))  Protein / creatinine ratio, urine     Status: None   Collection Time: 01/22/15  4:05 PM  Result Value Ref Range   Creatinine, Urine 29.00 mg/dL   Total Protein, Urine <6 mg/dL   Protein Creatinine Ratio        0.00 - 0.15 mg/mg[Cre]  CBC     Status: Abnormal   Collection Time: 01/22/15  4:45 PM  Result Value Ref Range   WBC 12.4 (H) 4.0 - 10.5 K/uL   RBC 3.73 (L) 3.87 - 5.11 MIL/uL   Hemoglobin 10.2 (L) 12.0 - 15.0 g/dL   HCT 31.7 (L) 36.0 - 46.0 %   MCV 85.0 78.0 - 100.0 fL   MCH 27.3 26.0 - 34.0 pg   MCHC 32.2 30.0 - 36.0 g/dL   RDW 14.7 11.5 - 15.5 %   Platelets 226 150 - 400 K/uL  Comprehensive metabolic panel     Status: Abnormal   Collection Time: 01/22/15  4:45 PM  Result Value Ref Range   Sodium 138 135 - 145 mmol/L   Potassium 3.2 (L) 3.5 - 5.1 mmol/L   Chloride 111 101 - 111 mmol/L   CO2 24 22 - 32 mmol/L   Glucose, Bld 101 (H) 65 - 99 mg/dL   BUN <5 (L) 6 - 20 mg/dL   Creatinine, Ser 0.61 0.44 - 1.00 mg/dL   Calcium 8.2 (L) 8.9 - 10.3 mg/dL   Total Protein 6.2 (L) 6.5 - 8.1 g/dL   Albumin 2.8 (L) 3.5 - 5.0 g/dL   AST 16 15 - 41 U/L   ALT 10 (L) 14 - 54 U/L   Alkaline Phosphatase 98 38 - 126 U/L   Total Bilirubin 0.3 0.3 - 1.2 mg/dL   GFR calc non Af Amer >60 >60 mL/min   GFR calc Af Amer >60 >60 mL/min   Anion gap 3 (L) 5 - 15   Dr. Matthew Saras called > reviewed vital signs, labs, headache status > discharge home, off work tomorrow, follow-up in office on Friday; call if no improvement in headache.    Assessment and Plan  29 y.o. G2P1001 at [redacted]w[redacted]d IUP  Gestational Hypertension Hypokalemia Reactive NST  Discharge to home Extra strength tylenol (1000 mg TID) prn  headache KCL 20 MEQ x 1 Remain off work tomorrow  Preeclampsia precautions Kick counts  Gwen Pounds, CNM 01/22/2015 7:01 PM

## 2015-01-22 NOTE — MAU Note (Signed)
Woke up with headache today, taken Tylenol twice, no relief. See spots when she coughs, otherwise no changes. No change in RUQ pain, increased swelling , checked BP at home - it was elevated.  Hx pre eclampsia with first

## 2015-01-25 LAB — URINE CULTURE

## 2015-01-28 ENCOUNTER — Observation Stay (HOSPITAL_COMMUNITY)
Admission: AD | Admit: 2015-01-28 | Discharge: 2015-01-29 | Disposition: A | Discharge status: Home / Self Care | Payer: Commercial Managed Care - HMO | Source: Ambulatory Visit | Attending: Obstetrics & Gynecology | Admitting: Obstetrics & Gynecology

## 2015-01-28 ENCOUNTER — Encounter (HOSPITAL_COMMUNITY): Payer: Self-pay | Admitting: *Deleted

## 2015-01-28 ENCOUNTER — Observation Stay (HOSPITAL_COMMUNITY): Payer: Commercial Managed Care - HMO

## 2015-01-28 DIAGNOSIS — R1011 Right upper quadrant pain: Secondary | ICD-10-CM | POA: Diagnosis not present

## 2015-01-28 DIAGNOSIS — R2240 Localized swelling, mass and lump, unspecified lower limb: Secondary | ICD-10-CM | POA: Diagnosis not present

## 2015-01-28 DIAGNOSIS — O9989 Other specified diseases and conditions complicating pregnancy, childbirth and the puerperium: Principal | ICD-10-CM | POA: Insufficient documentation

## 2015-01-28 DIAGNOSIS — R519 Headache, unspecified: Secondary | ICD-10-CM

## 2015-01-28 DIAGNOSIS — O133 Gestational [pregnancy-induced] hypertension without significant proteinuria, third trimester: Secondary | ICD-10-CM | POA: Diagnosis not present

## 2015-01-28 DIAGNOSIS — O139 Gestational [pregnancy-induced] hypertension without significant proteinuria, unspecified trimester: Secondary | ICD-10-CM

## 2015-01-28 DIAGNOSIS — O26893 Other specified pregnancy related conditions, third trimester: Secondary | ICD-10-CM

## 2015-01-28 DIAGNOSIS — Z3A28 28 weeks gestation of pregnancy: Secondary | ICD-10-CM | POA: Insufficient documentation

## 2015-01-28 DIAGNOSIS — R51 Headache: Secondary | ICD-10-CM

## 2015-01-28 LAB — TYPE AND SCREEN
ABO/RH(D): A POS
Antibody Screen: NEGATIVE

## 2015-01-28 LAB — COMPREHENSIVE METABOLIC PANEL
ALT: 10 U/L — ABNORMAL LOW (ref 14–54)
ANION GAP: 8 (ref 5–15)
AST: 14 U/L — ABNORMAL LOW (ref 15–41)
Albumin: 2.8 g/dL — ABNORMAL LOW (ref 3.5–5.0)
Alkaline Phosphatase: 105 U/L (ref 38–126)
BILIRUBIN TOTAL: 0.4 mg/dL (ref 0.3–1.2)
BUN: <5 mg/dL — ABNORMAL LOW (ref 6–20)
CALCIUM: 8.4 mg/dL — AB (ref 8.9–10.3)
CO2: 23 mmol/L (ref 22–32)
Chloride: 108 mmol/L (ref 101–111)
Creatinine, Ser: 0.55 mg/dL (ref 0.44–1.00)
GFR calc non Af Amer: >60 mL/min (ref >60)
GLUCOSE: 77 mg/dL (ref 65–99)
Potassium: 3.2 mmol/L — ABNORMAL LOW (ref 3.5–5.1)
Sodium: 139 mmol/L (ref 135–145)
TOTAL PROTEIN: 6.3 g/dL — AB (ref 6.5–8.1)

## 2015-01-28 LAB — CBC
HEMATOCRIT: 31.7 % — AB (ref 36.0–46.0)
HEMOGLOBIN: 10.2 g/dL — AB (ref 12.0–15.0)
MCH: 27.4 pg (ref 26.0–34.0)
MCHC: 32.2 g/dL (ref 30.0–36.0)
MCV: 85.2 fL (ref 78.0–100.0)
Platelets: 220 10*3/uL (ref 150–400)
RBC: 3.72 MIL/uL — ABNORMAL LOW (ref 3.87–5.11)
RDW: 14.9 % (ref 11.5–15.5)
WBC: 12.5 10*3/uL — ABNORMAL HIGH (ref 4.0–10.5)

## 2015-01-28 LAB — PROTEIN / CREATININE RATIO, URINE
CREATININE, URINE: 117 mg/dL
PROTEIN CREATININE RATIO: 0.21 mg/mg{creat} — AB (ref 0.00–0.15)
TOTAL PROTEIN, URINE: 25 mg/dL

## 2015-01-28 LAB — URIC ACID: URIC ACID, SERUM: 5.8 mg/dL (ref 2.3–6.6)

## 2015-01-28 LAB — LACTATE DEHYDROGENASE: LDH: 124 U/L (ref 98–192)

## 2015-01-28 MED ORDER — FERROUS SULFATE 325 (65 FE) MG PO TABS
325.0000 mg | ORAL_TABLET | Freq: Every day | ORAL | Status: DC
  Administered 2015-01-29: 325 mg via ORAL
  Filled 2015-01-28: qty 1

## 2015-01-28 MED ORDER — PRENATAL MULTIVITAMIN CH
1.0000 | ORAL_TABLET | Freq: Every day | ORAL | Status: DC
  Administered 2015-01-29: 1 via ORAL
  Filled 2015-01-28: qty 1

## 2015-01-28 MED ORDER — OXYCODONE-ACETAMINOPHEN 5-325 MG PO TABS: 1.0000 | ORAL_TABLET | Freq: Once | ORAL | Status: DC

## 2015-01-28 MED ORDER — CALCIUM CARBONATE ANTACID 500 MG PO CHEW: 2.0000 | CHEWABLE_TABLET | ORAL | Status: DC | PRN

## 2015-01-28 MED ORDER — ZOLPIDEM TARTRATE 5 MG PO TABS: 5.0000 mg | ORAL_TABLET | Freq: Every evening | ORAL | Status: DC | PRN

## 2015-01-28 MED ORDER — BUTALBITAL-APAP-CAFFEINE 50-325-40 MG PO TABS
2.0000 | ORAL_TABLET | Freq: Once | ORAL | Status: AC
  Administered 2015-01-28: 2 via ORAL
  Filled 2015-01-28: qty 2

## 2015-01-28 MED ORDER — ACETAMINOPHEN 325 MG PO TABS: 650.0000 mg | ORAL_TABLET | ORAL | Status: DC | PRN

## 2015-01-28 MED ORDER — DOCUSATE SODIUM 100 MG PO CAPS
100.0000 mg | ORAL_CAPSULE | Freq: Every day | ORAL | Status: DC
Start: 2015-01-28 — End: 2015-01-29
  Filled 2015-01-28: qty 1

## 2015-01-28 NOTE — H&P (Signed)
Barbara Scott is a 29 y.o. female sent from office for evaluation of elevated BPs, HA unresponsive to Tylenol, and RUQ pain.  The patient has had elevated BPs over the last couple weeks which are mild range in the office and up to 242A systolic at home.  The HA woke the patient up this AM and did not improved with Tylenol or Fioricet in MAU; improved with Percocet 5/325.  Once HA improved, BPs normalized.  The RUQ pain has been present x several weeks and is stable in severity.  Antepartum course complicated by h/o macrosomia and shoulder dystocia; plans for primary C/S this pregnancy.  She does have h/o pre-eclampsia with last pregnancy.  Maternal Medical History:  Reason for admission: Nausea.  Fetal activity: Perceived fetal activity is normal.   Last perceived fetal movement was within the past hour.    Prenatal complications: PIH.   Prenatal Complications - Diabetes: none.    OB History    Gravida Para Term Preterm AB TAB SAB Ectopic Multiple Living   2 1 1       1      Past Medical History  Diagnosis Date  . Headache(784.0)     migraines  . Skin lesions 01/2014    chest, right axilla, abd., back  . Pregnancy induced hypertension    Past Surgical History  Procedure Laterality Date  . Tonsillectomy    . Eye surgery    . Knee arthroscopy    . Cholesteatoma excision    . Cholecystectomy    . Melanoma excision    . Dilation and evacuation N/A 01/01/2014    Procedure: DILATATION AND EVACUATION;  Surgeon: Margarette Asal, MD;  Location: [facility];  Service: Gynecology;  Laterality: N/A;  . Nevus excision  08/07/2004    multiple  . Lesion removal N/A 02/02/2014    Procedure: MINOR EXICISION OF LESION X3  RIGHT AXILLA, CHEST AND ABDOMEN;  Surgeon: Alphonsa Overall, MD;  Location: [facility];  Service: General;  Laterality: N/A;   Family History: family history includes Cancer in her father, maternal grandfather, and mother; Heart disease in her maternal uncle;  Hypertension in her maternal uncle and mother; Thyroid disease in her mother. Social History:  reports that she has never smoked. She has never used smokeless tobacco. She reports that she does not drink alcohol or use illicit drugs.   Prenatal Transfer Tool  Maternal Diabetes: No Genetic Screening: Normal Maternal Ultrasounds/Referrals: Normal Fetal Ultrasounds or other Referrals:  None Maternal Substance Abuse:  No Significant Maternal Medications:  None Significant Maternal Lab Results:  None Other Comments:  None  Review of Systems  Constitutional: Negative for fever and chills.  Eyes: Negative for blurred vision and double vision.  Respiratory: Negative for cough and shortness of breath.   Cardiovascular: Positive for leg swelling. Negative for chest pain.  Gastrointestinal: Positive for abdominal pain. Negative for heartburn, nausea and vomiting.  Neurological: Positive for headaches.      Blood pressure 136/82, pulse 70, temperature 98.1 F (36.7 C), temperature source Oral, resp. rate 16, height 5\' 4"  (1.626 m), weight 101.56 kg (223 lb 14.4 oz), unknown if currently breastfeeding. Maternal Exam:  Abdomen: Patient reports the following abdominal tenderness: RUQ.  Fundal height is c/w dates.   Estimated fetal weight is 5.       Physical Exam  Constitutional: She is oriented to person, place, and time. She appears well-developed and well-nourished.  GI: Soft. There is tenderness in the right  upper quadrant. There is no rebound and no guarding.  Neurological: She is alert and oriented to person, place, and time. She has normal reflexes.  Skin: Skin is warm and dry.  Psychiatric: She has a normal mood and affect. Her behavior is normal.    Prenatal labs: ABO, Rh: --/--/A POS (10/24 1435) Antibody: NEG (10/24 1435) Rubella:   RPR:    HBsAg:    HIV:    GBS:     Assessment/Plan: 29yo G3P1011 at [redacted]w[redacted]d admitted to r/o pre-eclampsia -24 hour urine collection -Labs  in am -Serial BPs -U/S for EFW -NST q shift -SCDs   Seena Ritacco 01/28/2015, 8:13 PM

## 2015-01-28 NOTE — MAU Provider Note (Signed)
History     CSN: [id number]  Arrival date and time: 01/28/15 1207   First Provider Initiated Contact with Patient 01/28/15 1249         Chief Complaint  Patient presents with  . Hypertension   HPI  Barbara Scott is a 29 y.o. G2P1001 at [redacted]w[redacted]d who presents with hypertension & headache.  Took BP at home this morning, it was 180/100. Went to office today and was sent here for evaluation.  Woke up at 3 am this morning with headache. Took 2 extra strength tylenol with no relief. Rates as 7/10.  Some blurry vision in car on way over here.  Reports epigastric pain; states it's an ongoing issue since first pregnancy.  Denies vaginal bleeding, LOF, or contractions.  Positive fetal movement.    OB History    Gravida Para Term Preterm AB TAB SAB Ectopic Multiple Living   2 1 1       1       Past Medical History  Diagnosis Date  . Headache(784.0)     migraines  . Skin lesions 01/2014    chest, right axilla, abd., back  . Pregnancy induced hypertension     Past Surgical History  Procedure Laterality Date  . Tonsillectomy    . Eye surgery    . Knee arthroscopy    . Cholesteatoma excision    . Cholecystectomy    . Melanoma excision    . Dilation and evacuation N/A 01/01/2014    Procedure: DILATATION AND EVACUATION;  Surgeon: Margarette Asal, MD;  Location: [facility];  Service: Gynecology;  Laterality: N/A;  . Nevus excision  08/07/2004    multiple  . Lesion removal N/A 02/02/2014    Procedure: MINOR EXICISION OF LESION X3  RIGHT AXILLA, CHEST AND ABDOMEN;  Surgeon: Alphonsa Overall, MD;  Location: [place];  Service: General;  Laterality: N/A;    Family History  Problem Relation Age of Onset  . Hypertension Mother   . Thyroid disease Mother   . Cancer Mother     melanoma  . Hypertension Maternal Aunt   . Heart attack Maternal Uncle   . Hypertension Maternal Uncle   . Cancer Maternal Grandfather     lung prostate    Social History  Substance Use Topics   . Smoking status: Never Smoker   . Smokeless tobacco: Never Used  . Alcohol Use: No    Allergies:  Allergies  Allergen Reactions  . Bee Venom Swelling  . Shrimp [Shellfish Allergy] Swelling    FACIAL SWELLING  . Vicodin [Hydrocodone-Acetaminophen] Itching    Prescriptions prior to admission  Medication Sig Dispense Refill Last Dose  . acetaminophen (TYLENOL) 325 MG tablet Take 650 mg by mouth every 6 (six) hours as needed for moderate pain or headache.   Past Week at 1330  . EPINEPHrine (EPIPEN 2-PAK) 0.3 mg/0.3 mL IJ SOAJ injection Inject 0.3 mg into the muscle once.   rescue  . Prenatal Vit-Fe Fumarate-FA (PRENATAL MULTIVITAMIN) TABS tablet Take 2 tablets by mouth daily at 12 noon.    01/21/2015 at Unknown time    Review of Systems  Constitutional: Negative.   Eyes: Positive for blurred vision.  Respiratory: Negative.   Cardiovascular: Negative.   Gastrointestinal: Negative.   Genitourinary: Negative.   Neurological: Positive for headaches.   Physical Exam   Blood pressure 142/88, pulse 82, unknown if currently breastfeeding..  Patient Vitals for the past 24 hrs:  BP Pulse  01/28/15 1345 142/88  mmHg 82  01/28/15 1330 133/74 mmHg 76  01/28/15 1315 134/80 mmHg 83  01/28/15 1300 134/78 mmHg 89  01/28/15 1245 129/81 mmHg 79  01/28/15 1238 141/87 mmHg 87      Physical Exam  Nursing note and vitals reviewed. Constitutional: She is oriented to person, place, and time. She appears well-developed and well-nourished. No distress.  HENT:  Head: Normocephalic and atraumatic.  Eyes: Conjunctivae are normal. Right eye exhibits no discharge. Left eye exhibits no discharge. No scleral icterus.  Neck: Normal range of motion.  Cardiovascular: Normal rate, regular rhythm and normal heart sounds.   No murmur heard. Respiratory: Effort normal and breath sounds normal. No respiratory distress. She has no wheezes.  GI: Soft.  Musculoskeletal: She exhibits edema (BLE).   Neurological: She is alert and oriented to person, place, and time. She has normal reflexes.  No clonus  Skin: Skin is warm and dry. She is not diaphoretic.  Psychiatric: She has a normal mood and affect. Her behavior is normal. Judgment and thought content normal.   Fetal Tracing:  Baseline: 130 Variability: moderate Accelerations: 15x15 Decelerations: none  Toco: uterine irritability    MAU Course  Procedures Results for orders placed or performed during the hospital encounter of 01/28/15 (from the past 24 hour(s))  Protein / creatinine ratio, urine     Status: Abnormal   Collection Time: 01/28/15 12:25 PM  Result Value Ref Range   Creatinine, Urine 117.00 mg/dL   Total Protein, Urine 25 mg/dL   Protein Creatinine Ratio 0.21 (H) 0.00 - 0.15 mg/mg[Cre]  CBC     Status: Abnormal   Collection Time: 01/28/15  1:00 PM  Result Value Ref Range   WBC 12.5 (H) 4.0 - 10.5 K/uL   RBC 3.72 (L) 3.87 - 5.11 MIL/uL   Hemoglobin 10.2 (L) 12.0 - 15.0 g/dL   HCT 31.7 (L) 36.0 - 46.0 %   MCV 85.2 78.0 - 100.0 fL   MCH 27.4 26.0 - 34.0 pg   MCHC 32.2 30.0 - 36.0 g/dL   RDW 14.9 11.5 - 15.5 %   Platelets 220 150 - 400 K/uL  Comprehensive metabolic panel     Status: Abnormal   Collection Time: 01/28/15  1:00 PM  Result Value Ref Range   Sodium 139 135 - 145 mmol/L   Potassium 3.2 (L) 3.5 - 5.1 mmol/L   Chloride 108 101 - 111 mmol/L   CO2 23 22 - 32 mmol/L   Glucose, Bld 77 65 - 99 mg/dL   BUN <5 (L) 6 - 20 mg/dL   Creatinine, Ser 0.55 0.44 - 1.00 mg/dL   Calcium 8.4 (L) 8.9 - 10.3 mg/dL   Total Protein 6.3 (L) 6.5 - 8.1 g/dL   Albumin 2.8 (L) 3.5 - 5.0 g/dL   AST 14 (L) 15 - 41 U/L   ALT 10 (L) 14 - 54 U/L   Alkaline Phosphatase 105 38 - 126 U/L   Total Bilirubin 0.4 0.3 - 1.2 mg/dL   GFR calc non Af Amer >60 >60 mL/min   GFR calc Af Amer >60 >60 mL/min   Anion gap 8 5 - 15  Lactate dehydrogenase     Status: None   Collection Time: 01/28/15  1:00 PM  Result Value Ref Range    LDH 124 98 - 192 U/L  Uric acid     Status: None   Collection Time: 01/28/15  1:00 PM  Result Value Ref Range   Uric Acid, Serum 5.8 2.3 -  6.6 mg/dL    MDM Category 1 tracing No severe range BPs Fioricet for headache - minimal relief - pt reports further relief after eating.  1352- S/w Dr. Lynnette Caffey about BPs, headache, & labs. Will admit for observation  Assessment and Plan  A:  1. Gestational hypertension w/o significant proteinuria in 3rd trimester   2. Headache in pregnancy, antepartum, third trimester    P; Admit for observation per Dr. Lynnette Caffey.   Jorje Guild, NP  01/28/2015, 12:48 PM

## 2015-01-28 NOTE — MAU Note (Signed)
C/o headache since 0300 this AM; c/o increased BP and sent by [facility] for further evaluation;

## 2015-01-28 NOTE — Plan of Care (Signed)
Problem: Consults Goal: Birthing Suites Patient Information Press F2 to bring up selections list   Pt < [redacted] weeks EGA     

## 2015-01-29 LAB — COMPREHENSIVE METABOLIC PANEL
ALBUMIN: 2.4 g/dL — AB (ref 3.5–5.0)
ALK PHOS: 88 U/L (ref 38–126)
ALT: 8 U/L — ABNORMAL LOW (ref 14–54)
ANION GAP: 4 — AB (ref 5–15)
AST: 13 U/L — ABNORMAL LOW (ref 15–41)
BUN: <5 mg/dL — ABNORMAL LOW (ref 6–20)
CHLORIDE: 109 mmol/L (ref 101–111)
CO2: 25 mmol/L (ref 22–32)
Calcium: 8 mg/dL — ABNORMAL LOW (ref 8.9–10.3)
Creatinine, Ser: 0.55 mg/dL (ref 0.44–1.00)
GFR calc Af Amer: >60 mL/min (ref >60)
GFR calc non Af Amer: >60 mL/min (ref >60)
GLUCOSE: 90 mg/dL (ref 65–99)
POTASSIUM: 3.4 mmol/L — AB (ref 3.5–5.1)
Sodium: 138 mmol/L (ref 135–145)
Total Bilirubin: 0.4 mg/dL (ref 0.3–1.2)
Total Protein: 5.2 g/dL — ABNORMAL LOW (ref 6.5–8.1)

## 2015-01-29 LAB — PROTEIN, URINE, 24 HOUR
Collection Interval-UPROT: 24 hours
PROTEIN, 24H URINE: 278 mg/d — AB (ref 50–100)
Protein, Urine: 11 mg/dL
Urine Total Volume-UPROT: 2525 mL

## 2015-01-29 LAB — CBC
HEMATOCRIT: 29.8 % — AB (ref 36.0–46.0)
HEMOGLOBIN: 9.4 g/dL — AB (ref 12.0–15.0)
MCH: 27.1 pg (ref 26.0–34.0)
MCHC: 31.5 g/dL (ref 30.0–36.0)
MCV: 85.9 fL (ref 78.0–100.0)
Platelets: 164 10*3/uL (ref 150–400)
RBC: 3.47 MIL/uL — AB (ref 3.87–5.11)
RDW: 15.1 % (ref 11.5–15.5)
WBC: 13.2 10*3/uL — ABNORMAL HIGH (ref 4.0–10.5)

## 2015-01-29 MED ORDER — BUTALBITAL-APAP-CAFFEINE 50-325-40 MG PO TABS
2.0000 | ORAL_TABLET | Freq: Four times a day (QID) | ORAL | Status: DC | PRN
  Administered 2015-01-29: 2 via ORAL
  Filled 2015-01-29: qty 2

## 2015-01-29 MED ORDER — BUTALBITAL-APAP-CAFFEINE 50-325-40 MG PO TABS: 2.0000 | ORAL_TABLET | Freq: Four times a day (QID) | ORAL | Status: DC | PRN

## 2015-01-29 NOTE — Discharge Instructions (Signed)
Preeclampsia and Eclampsia °Preeclampsia is a serious condition that develops only during pregnancy. It is also called toxemia of pregnancy. This condition causes high blood pressure along with other symptoms, such as swelling and headaches. These may develop as the condition gets worse. Preeclampsia may occur 20 weeks or later into your pregnancy.  °Diagnosing and treating preeclampsia early is very important. If not treated early, it can cause serious problems for you and your baby. One problem it can lead to is eclampsia, which is a condition that causes muscle jerking or shaking (convulsions) in the mother. Delivering your baby is the best treatment for preeclampsia or eclampsia.  °RISK FACTORS °The cause of preeclampsia is not known. You may be more likely to develop preeclampsia if you have certain risk factors. These include:  °· Being pregnant for the first time. °· Having preeclampsia in a past pregnancy. °· Having a family history of preeclampsia. °· Having high blood pressure. °· Being pregnant with twins or triplets. °· Being 35 or older. °· Being African American. °· Having kidney disease or diabetes. °· Having medical conditions such as lupus or blood diseases. °· Being very overweight (obese). °SIGNS AND SYMPTOMS  °The earliest signs of preeclampsia are: °· High blood pressure. °· Increased protein in your urine. Your health care provider will check for this at every prenatal visit. °Other symptoms that can develop include:  °· Severe headaches. °· Sudden weight gain. °· Swelling of your hands, face, legs, and feet. °· Feeling sick to your stomach (nauseous) and throwing up (vomiting). °· Vision problems (blurred or double vision). °· Numbness in your face, arms, legs, and feet. °· Dizziness. °· Slurred speech. °· Sensitivity to bright lights. °· Abdominal pain. °DIAGNOSIS  °There are no screening tests for preeclampsia. Your health care provider will ask you about symptoms and check for signs of  preeclampsia during your prenatal visits. You may also have tests, including: °· Urine testing. °· Blood testing. °· Checking your baby's heart rate. °· Checking the health of your baby and your placenta using images created with sound waves (ultrasound). °TREATMENT  °You can work out the best treatment approach together with your health care provider. It is very important to keep all prenatal appointments. If you have an increased risk of preeclampsia, you may need more frequent prenatal exams. °· Your health care provider may prescribe bed rest. °· You may have to eat as little salt as possible. °· You may need to take medicine to lower your blood pressure if the condition does not respond to more conservative measures. °· You may need to stay in the hospital if your condition is severe. There, treatment will focus on controlling your blood pressure and fluid retention. You may also need to take medicine to prevent seizures. °· If the condition gets worse, your baby may need to be delivered early to protect you and the baby. You may have your labor started with medicine (be induced), or you may have a cesarean delivery. °· Preeclampsia usually goes away after the baby is born. °HOME CARE INSTRUCTIONS  °· Only take over-the-counter or prescription medicines as directed by your health care provider. °· Lie on your left side while resting. This keeps pressure off your baby. °· Elevate your feet while resting. °· Get regular exercise. Ask your health care provider what type of exercise is safe for you. °· Avoid caffeine and alcohol. °· Do not smoke. °· Drink 6-8 glasses of water every day. °· Eat a balanced diet   that is low in salt. Do not add salt to your food.  Avoid stressful situations as much as possible.  Get plenty of rest and sleep.  Keep all prenatal appointments and tests as scheduled. SEEK MEDICAL CARE IF:  You are gaining more weight than expected.  You have any headaches, abdominal pain, or  nausea.  You are bruising more than usual.  You feel dizzy or light-headed. SEEK IMMEDIATE MEDICAL CARE IF:   You develop sudden or severe swelling anywhere in your body. This usually happens in the legs.  You gain 5 lb (2.3 kg) or more in a week.  You have a severe headache, dizziness, problems with your vision, or confusion.  You have severe abdominal pain.  You have lasting nausea or vomiting.  You have a seizure.  You have trouble moving any part of your body.  You develop numbness in your body.  You have trouble speaking.  You have any abnormal bleeding.  You develop a stiff neck.  You pass out. MAKE SURE YOU:   Understand these instructions.  Will watch your condition.  Will get help right away if you are not doing well or get worse.   This information is not intended to replace advice given to you by your health care provider. Make sure you discuss any questions you have with your health care provider.   Document Released: 03/20/2000 Document Revised: 03/28/2013 Document Reviewed: 01/13/2013 Elsevier Interactive Patient Education 2016 [place]. Fetal Movement Counts Patient Name: __________________________________________________ Patient Due Date: ____________________ Performing a fetal movement count is highly recommended in high-risk pregnancies, but it is good for every pregnant woman to do. Your health care provider may ask you to start counting fetal movements at 28 weeks of the pregnancy. Fetal movements often increase:  After eating a full meal.  After physical activity.  After eating or drinking something sweet or cold.  At rest. Pay attention to when you feel the baby is most active. This will help you notice a pattern of your baby's sleep and wake cycles and what factors contribute to an increase in fetal movement. It is important to perform a fetal movement count at the same time each day when your baby is normally most active.  HOW TO  COUNT FETAL MOVEMENTS  Find a quiet and comfortable area to sit or lie down on your left side. Lying on your left side provides the best blood and oxygen circulation to your baby.  Write down the day and time on a sheet of paper or in a journal.  Start counting kicks, flutters, swishes, rolls, or jabs in a 2-hour period. You should feel at least 10 movements within 2 hours.  If you do not feel 10 movements in 2 hours, wait 2-3 hours and count again. Look for a change in the pattern or not enough counts in 2 hours. SEEK MEDICAL CARE IF:  You feel less than 10 counts in 2 hours, tried twice.  There is no movement in over an hour.  The pattern is changing or taking longer each day to reach 10 counts in 2 hours.  You feel the baby is not moving as he or she usually does. Date: ____________ Movements: ____________ Start time: ____________ Elizebeth Koller time: ____________  Date: ____________ Movements: ____________ Start time: ____________ Elizebeth Koller time: ____________ Date: ____________ Movements: ____________ Start time: ____________ Elizebeth Koller time: ____________ Date: ____________ Movements: ____________ Start time: ____________ Elizebeth Koller time: ____________ Date: ____________ Movements: ____________ Start time: ____________ Elizebeth Koller time: ____________ Date:  ____________ Movements: ____________ Start time: ____________ Elizebeth Koller time: ____________ Date: ____________ Movements: ____________ Start time: ____________ Elizebeth Koller time: ____________ Date: ____________ Movements: ____________ Start time: ____________ Elizebeth Koller time: ____________  Date: ____________ Movements: ____________ Start time: ____________ Elizebeth Koller time: ____________ Date: ____________ Movements: ____________ Start time: ____________ Elizebeth Koller time: ____________ Date: ____________ Movements: ____________ Start time: ____________ Elizebeth Koller time: ____________ Date: ____________ Movements: ____________ Start time: ____________ Elizebeth Koller time: ____________ Date:  ____________ Movements: ____________ Start time: ____________ Elizebeth Koller time: ____________ Date: ____________ Movements: ____________ Start time: ____________ Elizebeth Koller time: ____________ Date: ____________ Movements: ____________ Start time: ____________ Elizebeth Koller time: ____________  Date: ____________ Movements: ____________ Start time: ____________ Elizebeth Koller time: ____________ Date: ____________ Movements: ____________ Start time: ____________ Elizebeth Koller time: ____________ Date: ____________ Movements: ____________ Start time: ____________ Elizebeth Koller time: ____________ Date: ____________ Movements: ____________ Start time: ____________ Elizebeth Koller time: ____________ Date: ____________ Movements: ____________ Start time: ____________ Elizebeth Koller time: ____________ Date: ____________ Movements: ____________ Start time: ____________ Elizebeth Koller time: ____________ Date: ____________ Movements: ____________ Start time: ____________ Elizebeth Koller time: ____________  Date: ____________ Movements: ____________ Start time: ____________ Elizebeth Koller time: ____________ Date: ____________ Movements: ____________ Start time: ____________ Elizebeth Koller time: ____________ Date: ____________ Movements: ____________ Start time: ____________ Elizebeth Koller time: ____________ Date: ____________ Movements: ____________ Start time: ____________ Elizebeth Koller time: ____________ Date: ____________ Movements: ____________ Start time: ____________ Elizebeth Koller time: ____________ Date: ____________ Movements: ____________ Start time: ____________ Elizebeth Koller time: ____________ Date: ____________ Movements: ____________ Start time: ____________ Elizebeth Koller time: ____________  Date: ____________ Movements: ____________ Start time: ____________ Elizebeth Koller time: ____________ Date: ____________ Movements: ____________ Start time: ____________ Elizebeth Koller time: ____________ Date: ____________ Movements: ____________ Start time: ____________ Elizebeth Koller time: ____________ Date: ____________ Movements: ____________ Start  time: ____________ Elizebeth Koller time: ____________ Date: ____________ Movements: ____________ Start time: ____________ Elizebeth Koller time: ____________ Date: ____________ Movements: ____________ Start time: ____________ Elizebeth Koller time: ____________ Date: ____________ Movements: ____________ Start time: ____________ Elizebeth Koller time: ____________  Date: ____________ Movements: ____________ Start time: ____________ Elizebeth Koller time: ____________ Date: ____________ Movements: ____________ Start time: ____________ Elizebeth Koller time: ____________ Date: ____________ Movements: ____________ Start time: ____________ Elizebeth Koller time: ____________ Date: ____________ Movements: ____________ Start time: ____________ Elizebeth Koller time: ____________ Date: ____________ Movements: ____________ Start time: ____________ Elizebeth Koller time: ____________ Date: ____________ Movements: ____________ Start time: ____________ Elizebeth Koller time: ____________ Date: ____________ Movements: ____________ Start time: ____________ Elizebeth Koller time: ____________  Date: ____________ Movements: ____________ Start time: ____________ Elizebeth Koller time: ____________ Date: ____________ Movements: ____________ Start time: ____________ Elizebeth Koller time: ____________ Date: ____________ Movements: ____________ Start time: ____________ Elizebeth Koller time: ____________ Date: ____________ Movements: ____________ Start time: ____________ Elizebeth Koller time: ____________ Date: ____________ Movements: ____________ Start time: ____________ Elizebeth Koller time: ____________ Date: ____________ Movements: ____________ Start time: ____________ Elizebeth Koller time: ____________ Date: ____________ Movements: ____________ Start time: ____________ Elizebeth Koller time: ____________  Date: ____________ Movements: ____________ Start time: ____________ Elizebeth Koller time: ____________ Date: ____________ Movements: ____________ Start time: ____________ Elizebeth Koller time: ____________ Date: ____________ Movements: ____________ Start time: ____________ Elizebeth Koller time:  ____________ Date: ____________ Movements: ____________ Start time: ____________ Elizebeth Koller time: ____________ Date: ____________ Movements: ____________ Start time: ____________ Elizebeth Koller time: ____________ Date: ____________ Movements: ____________ Start time: ____________ Elizebeth Koller time: ____________   This information is not intended to replace advice given to you by your health care provider. Make sure you discuss any questions you have with your health care provider.   Document Released: 04/22/2006 Document Revised: 04/13/2014 Document Reviewed: 01/18/2012 Elsevier Interactive Patient Education [employer].

## 2015-01-29 NOTE — Discharge Summary (Signed)
Physician Discharge Summary  Patient ID: Barbara Scott MRN: [id number] DOB/AGE: [birth year] 29 y.o.  Admit date: 01/28/2015 Discharge date: 01/29/2015  Admission Diagnoses:gestational hypertension, headache  Discharge Diagnoses:  Active Problems:   Gestational hypertension Headache  Discharged Condition: good  Hospital Course: admitted for observation. Headache relieved with fioricet. BPs stable 130/80s. Labs normal and 24 hour urine completed prior to discharge.  Consults: None  Significant Diagnostic Studies: labs:  Results for orders placed or performed during the hospital encounter of 01/28/15 (from the past 48 hour(s))  Protein / creatinine ratio, urine     Status: Abnormal   Collection Time: 01/28/15 12:25 PM  Result Value Ref Range   Creatinine, Urine 117.00 mg/dL   Total Protein, Urine 25 mg/dL    Comment: NO NORMAL RANGE ESTABLISHED FOR THIS TEST   Protein Creatinine Ratio 0.21 (H) 0.00 - 0.15 mg/mg[Cre]  CBC     Status: Abnormal   Collection Time: 01/28/15  1:00 PM  Result Value Ref Range   WBC 12.5 (H) 4.0 - 10.5 K/uL   RBC 3.72 (L) 3.87 - 5.11 MIL/uL   Hemoglobin 10.2 (L) 12.0 - 15.0 g/dL   HCT 31.7 (L) 36.0 - 46.0 %   MCV 85.2 78.0 - 100.0 fL   MCH 27.4 26.0 - 34.0 pg   MCHC 32.2 30.0 - 36.0 g/dL   RDW 14.9 11.5 - 15.5 %   Platelets 220 150 - 400 K/uL  Comprehensive metabolic panel     Status: Abnormal   Collection Time: 01/28/15  1:00 PM  Result Value Ref Range   Sodium 139 135 - 145 mmol/L   Potassium 3.2 (L) 3.5 - 5.1 mmol/L   Chloride 108 101 - 111 mmol/L   CO2 23 22 - 32 mmol/L   Glucose, Bld 77 65 - 99 mg/dL   BUN <5 (L) 6 - 20 mg/dL    Comment: REPEATED TO VERIFY   Creatinine, Ser 0.55 0.44 - 1.00 mg/dL   Calcium 8.4 (L) 8.9 - 10.3 mg/dL   Total Protein 6.3 (L) 6.5 - 8.1 g/dL   Albumin 2.8 (L) 3.5 - 5.0 g/dL   AST 14 (L) 15 - 41 U/L   ALT 10 (L) 14 - 54 U/L   Alkaline Phosphatase 105 38 - 126 U/L   Total Bilirubin 0.4 0.3 - 1.2 mg/dL   GFR calc non Af Amer >60 >60 mL/min   GFR calc Af Amer >60 >60 mL/min    Comment: (NOTE) The eGFR has been calculated using the CKD EPI equation. This calculation has not been validated in all clinical situations. eGFR's persistently <60 mL/min signify possible Chronic Kidney Disease.    Anion gap 8 5 - 15  Lactate dehydrogenase     Status: None   Collection Time: 01/28/15  1:00 PM  Result Value Ref Range   LDH 124 98 - 192 U/L  Uric acid     Status: None   Collection Time: 01/28/15  1:00 PM  Result Value Ref Range   Uric Acid, Serum 5.8 2.3 - 6.6 mg/dL  Type and screen [place]     Status: None   Collection Time: 01/28/15  2:35 PM  Result Value Ref Range   ABO/RH(D) A POS    Antibody Screen NEG    Sample Expiration 01/31/2015   Comprehensive metabolic panel     Status: Abnormal   Collection Time: 01/29/15  5:54 AM  Result Value Ref Range   Sodium 138 135 - 145  mmol/L   Potassium 3.4 (L) 3.5 - 5.1 mmol/L   Chloride 109 101 - 111 mmol/L   CO2 25 22 - 32 mmol/L   Glucose, Bld 90 65 - 99 mg/dL   BUN <5 (L) 6 - 20 mg/dL    Comment: REPEATED TO VERIFY   Creatinine, Ser 0.55 0.44 - 1.00 mg/dL   Calcium 8.0 (L) 8.9 - 10.3 mg/dL   Total Protein 5.2 (L) 6.5 - 8.1 g/dL   Albumin 2.4 (L) 3.5 - 5.0 g/dL   AST 13 (L) 15 - 41 U/L   ALT 8 (L) 14 - 54 U/L   Alkaline Phosphatase 88 38 - 126 U/L   Total Bilirubin 0.4 0.3 - 1.2 mg/dL   GFR calc non Af Amer >60 >60 mL/min   GFR calc Af Amer >60 >60 mL/min    Comment: (NOTE) The eGFR has been calculated using the CKD EPI equation. This calculation has not been validated in all clinical situations. eGFR's persistently <60 mL/min signify possible Chronic Kidney Disease.    Anion gap 4 (L) 5 - 15  CBC     Status: Abnormal   Collection Time: 01/29/15  5:54 AM  Result Value Ref Range   WBC 13.2 (H) 4.0 - 10.5 K/uL    Comment: WHITE COUNT CONFIRMED ON SMEAR   RBC 3.47 (L) 3.87 - 5.11 MIL/uL   Hemoglobin 9.4 (L) 12.0  - 15.0 g/dL   HCT 29.8 (L) 36.0 - 46.0 %   MCV 85.9 78.0 - 100.0 fL   MCH 27.1 26.0 - 34.0 pg   MCHC 31.5 30.0 - 36.0 g/dL   RDW 15.1 11.5 - 15.5 %   Platelets 164 150 - 400 K/uL    Treatments: analgesia: fioricet  Discharge Exam: Blood pressure 131/80, pulse 86, temperature 97.8 F (36.6 C), temperature source Oral, resp. rate 20, height _0  (1.626 m), weight 222 lb 9 oz (100.954 kg), unknown if currently breastfeeding. General appearance: alert, cooperative and no distress  Disposition: 01-Home or Self Care     Medication List    TAKE these medications        acetaminophen 500 MG tablet  Commonly known as:  TYLENOL  Take 1,000 mg by mouth every 6 (six) hours as needed.     butalbital-acetaminophen-caffeine 50-325-40 MG tablet  Commonly known as:  FIORICET, ESGIC  Take 2 tablets by mouth every 6 (six) hours as needed for headache.     EPIPEN 2-PAK 0.3 mg/0.3 mL Soaj injection  Generic drug:  EPINEPHrine  Inject 0.3 mg into the muscle once.     ferrous sulfate 325 (65 FE) MG tablet  Take 325 mg by mouth daily with breakfast.     prenatal multivitamin Tabs tablet  Take 2 tablets by mouth daily at 12 noon.           Follow-up Information    Follow up In 2 days.      Signed: Benjiman Core E 01/29/2015, 6:21 PM

## 2015-01-29 NOTE — Progress Notes (Signed)
Feels better, HA gone  Filed Vitals:   01/29/15 1621  BP: 131/80  Pulse: 86  Temp: 97.8 F (36.6 C)  Resp: 20   A/P: D/C home         OOW until appt in office in 2 days         Fioricet #30 prn HA         Will call if HA worse or other sxs occur

## 2015-01-29 NOTE — Progress Notes (Signed)
34 2/7 wks  HA totally relieved last evening with fioricet. HA returning this am-mild so far. HA was left frontal yesterday. Today HA is right frontal. Some visual spots yesterday, none today. Hx of migraine although this HA is not typical of that. Mild RUQ discomfort all this pregnancy and last pregnancy. S/P cholecytectomy.  Filed Vitals:   01/29/15 0358  BP: 119/74  Pulse: 78  Temp: 98.6 F (37 C)  Resp: 18   DTR 2+ FHT cat one  Results for orders placed or performed during the hospital encounter of 01/28/15 (from the past 48 hour(s))  Protein / creatinine ratio, urine     Status: Abnormal   Collection Time: 01/28/15 12:25 PM  Result Value Ref Range   Creatinine, Urine 117.00 mg/dL   Total Protein, Urine 25 mg/dL    Comment: NO NORMAL RANGE ESTABLISHED FOR THIS TEST   Protein Creatinine Ratio 0.21 (H) 0.00 - 0.15 mg/mg[Cre]  CBC     Status: Abnormal   Collection Time: 01/28/15  1:00 PM  Result Value Ref Range   WBC 12.5 (H) 4.0 - 10.5 K/uL   RBC 3.72 (L) 3.87 - 5.11 MIL/uL   Hemoglobin 10.2 (L) 12.0 - 15.0 g/dL   HCT 31.7 (L) 36.0 - 46.0 %   MCV 85.2 78.0 - 100.0 fL   MCH 27.4 26.0 - 34.0 pg   MCHC 32.2 30.0 - 36.0 g/dL   RDW 14.9 11.5 - 15.5 %   Platelets 220 150 - 400 K/uL  Comprehensive metabolic panel     Status: Abnormal   Collection Time: 01/28/15  1:00 PM  Result Value Ref Range   Sodium 139 135 - 145 mmol/L   Potassium 3.2 (L) 3.5 - 5.1 mmol/L   Chloride 108 101 - 111 mmol/L   CO2 23 22 - 32 mmol/L   Glucose, Bld 77 65 - 99 mg/dL   BUN <5 (L) 6 - 20 mg/dL    Comment: REPEATED TO VERIFY   Creatinine, Ser 0.55 0.44 - 1.00 mg/dL   Calcium 8.4 (L) 8.9 - 10.3 mg/dL   Total Protein 6.3 (L) 6.5 - 8.1 g/dL   Albumin 2.8 (L) 3.5 - 5.0 g/dL   AST 14 (L) 15 - 41 U/L   ALT 10 (L) 14 - 54 U/L   Alkaline Phosphatase 105 38 - 126 U/L   Total Bilirubin 0.4 0.3 - 1.2 mg/dL   GFR calc non Af Amer >60 >60 mL/min   GFR calc Af Amer >60 >60 mL/min    Comment: (NOTE) The  eGFR has been calculated using the CKD EPI equation. This calculation has not been validated in all clinical situations. eGFR's persistently <60 mL/min signify possible Chronic Kidney Disease.    Anion gap 8 5 - 15  Lactate dehydrogenase     Status: None   Collection Time: 01/28/15  1:00 PM  Result Value Ref Range   LDH 124 98 - 192 U/L  Uric acid     Status: None   Collection Time: 01/28/15  1:00 PM  Result Value Ref Range   Uric Acid, Serum 5.8 2.3 - 6.6 mg/dL  Type and screen [place]     Status: None   Collection Time: 01/28/15  2:35 PM  Result Value Ref Range   ABO/RH(D) A POS    Antibody Screen NEG    Sample Expiration 01/31/2015   Comprehensive metabolic panel     Status: Abnormal   Collection Time: 01/29/15  5:54 AM  Result Value Ref Range   Sodium 138 135 - 145 mmol/L   Potassium 3.4 (L) 3.5 - 5.1 mmol/L   Chloride 109 101 - 111 mmol/L   CO2 25 22 - 32 mmol/L   Glucose, Bld 90 65 - 99 mg/dL   BUN <5 (L) 6 - 20 mg/dL    Comment: REPEATED TO VERIFY   Creatinine, Ser 0.55 0.44 - 1.00 mg/dL   Calcium 8.0 (L) 8.9 - 10.3 mg/dL   Total Protein 5.2 (L) 6.5 - 8.1 g/dL   Albumin 2.4 (L) 3.5 - 5.0 g/dL   AST 13 (L) 15 - 41 U/L   ALT 8 (L) 14 - 54 U/L   Alkaline Phosphatase 88 38 - 126 U/L   Total Bilirubin 0.4 0.3 - 1.2 mg/dL   GFR calc non Af Amer >60 >60 mL/min   GFR calc Af Amer >60 >60 mL/min    Comment: (NOTE) The eGFR has been calculated using the CKD EPI equation. This calculation has not been validated in all clinical situations. eGFR's persistently <60 mL/min signify possible Chronic Kidney Disease.    Anion gap 4 (L) 5 - 15  CBC     Status: Abnormal   Collection Time: 01/29/15  5:54 AM  Result Value Ref Range   WBC 13.2 (H) 4.0 - 10.5 K/uL    Comment: WHITE COUNT CONFIRMED ON SMEAR   RBC 3.47 (L) 3.87 - 5.11 MIL/uL   Hemoglobin 9.4 (L) 12.0 - 15.0 g/dL   HCT 29.8 (L) 36.0 - 46.0 %   MCV 85.9 78.0 - 100.0 fL   MCH 27.1 26.0 - 34.0  pg   MCHC 31.5 30.0 - 36.0 g/dL   RDW 15.1 11.5 - 15.5 %   Platelets 164 150 - 400 K/uL   U/S vtx, EFW about 5.5# (66%), AFI normal 24 hour urine in progress  A/P: HA         Labile BP-normal today         Hx of PP elevated BP last pregnancy          D/W patient-in view of normal BP and labs, will treat HA with fioricet, continue 24 hour urine and reevaluate mid day         All questions answered

## 2015-01-31 ENCOUNTER — Encounter (HOSPITAL_COMMUNITY): Payer: Self-pay | Admitting: *Deleted

## 2015-01-31 ENCOUNTER — Inpatient Hospital Stay (HOSPITAL_COMMUNITY)
Admission: AD | Admit: 2015-01-31 | Discharge: 2015-01-31 | Disposition: A | Discharge status: Home / Self Care | Payer: Commercial Managed Care - HMO | Source: Ambulatory Visit | Attending: Obstetrics and Gynecology | Admitting: Obstetrics and Gynecology

## 2015-01-31 DIAGNOSIS — O133 Gestational [pregnancy-induced] hypertension without significant proteinuria, third trimester: Secondary | ICD-10-CM

## 2015-01-31 DIAGNOSIS — Z3A34 34 weeks gestation of pregnancy: Secondary | ICD-10-CM

## 2015-01-31 LAB — COMPREHENSIVE METABOLIC PANEL
ALT: 9 U/L — ABNORMAL LOW (ref 14–54)
AST: 17 U/L (ref 15–41)
Albumin: 2.9 g/dL — ABNORMAL LOW (ref 3.5–5.0)
Alkaline Phosphatase: 104 U/L (ref 38–126)
Anion gap: 6 (ref 5–15)
BUN: <5 mg/dL — ABNORMAL LOW (ref 6–20)
CHLORIDE: 108 mmol/L (ref 101–111)
CO2: 25 mmol/L (ref 22–32)
Calcium: 8.4 mg/dL — ABNORMAL LOW (ref 8.9–10.3)
Creatinine, Ser: 0.53 mg/dL (ref 0.44–1.00)
Glucose, Bld: 75 mg/dL (ref 65–99)
POTASSIUM: 3.9 mmol/L (ref 3.5–5.1)
Sodium: 139 mmol/L (ref 135–145)
Total Bilirubin: 0.3 mg/dL (ref 0.3–1.2)
Total Protein: 6.1 g/dL — ABNORMAL LOW (ref 6.5–8.1)

## 2015-01-31 LAB — URINE MICROSCOPIC-ADD ON

## 2015-01-31 LAB — URINALYSIS, ROUTINE W REFLEX MICROSCOPIC
Bilirubin Urine: NEGATIVE
Glucose, UA: NEGATIVE mg/dL
Ketones, ur: NEGATIVE mg/dL
NITRITE: NEGATIVE
PROTEIN: NEGATIVE mg/dL
Specific Gravity, Urine: <1.005 — ABNORMAL LOW (ref 1.005–1.030)
UROBILINOGEN UA: 0.2 mg/dL (ref 0.0–1.0)
pH: 7 (ref 5.0–8.0)

## 2015-01-31 LAB — CBC
HCT: 32.4 % — ABNORMAL LOW (ref 36.0–46.0)
Hemoglobin: 10.5 g/dL — ABNORMAL LOW (ref 12.0–15.0)
MCH: 27.3 pg (ref 26.0–34.0)
MCHC: 32.4 g/dL (ref 30.0–36.0)
MCV: 84.4 fL (ref 78.0–100.0)
PLATELETS: 231 10*3/uL (ref 150–400)
RBC: 3.84 MIL/uL — ABNORMAL LOW (ref 3.87–5.11)
RDW: 15.1 % (ref 11.5–15.5)
WBC: 12.9 10*3/uL — AB (ref 4.0–10.5)

## 2015-01-31 LAB — PROTEIN / CREATININE RATIO, URINE
Creatinine, Urine: 60 mg/dL
Protein Creatinine Ratio: 0.17 mg/mg{Cre} — ABNORMAL HIGH (ref 0.00–0.15)
TOTAL PROTEIN, URINE: 10 mg/dL

## 2015-01-31 MED ORDER — LACTATED RINGERS IV SOLN
INTRAVENOUS | Status: DC
  Administered 2015-01-31: 14:00:00 via INTRAVENOUS

## 2015-01-31 MED ORDER — HYDRALAZINE HCL 20 MG/ML IJ SOLN: 10.0000 mg | Freq: Once | INTRAMUSCULAR | Status: DC | PRN

## 2015-01-31 MED ORDER — LABETALOL HCL 100 MG PO TABS
200.0000 mg | ORAL_TABLET | Freq: Once | ORAL | Status: AC
  Administered 2015-01-31: 200 mg via ORAL
  Filled 2015-01-31: qty 2

## 2015-01-31 MED ORDER — LABETALOL HCL 5 MG/ML IV SOLN: 20.0000 mg | INTRAVENOUS | Status: DC | PRN

## 2015-01-31 MED ORDER — BETAMETHASONE SOD PHOS & ACET 6 (3-3) MG/ML IJ SUSP
12.0000 mg | Freq: Once | INTRAMUSCULAR | Status: AC
  Administered 2015-01-31: 12 mg via INTRAMUSCULAR
  Filled 2015-01-31: qty 2

## 2015-01-31 MED ORDER — LABETALOL HCL 100 MG PO TABS: 200.0000 mg | ORAL_TABLET | Freq: Two times a day (BID) | ORAL | Status: DC

## 2015-01-31 NOTE — MAU Provider Note (Signed)
Chief Complaint:  Hypertension   First Provider Initiated Contact with Patient 01/31/15 1325     HPI: Barbara Scott is a 29 y.o. G2P1001 at [redacted]w[redacted]d who [facility]. Has been diagnosed with gestational hypertension this pregnancy. Reports new, intermittent headaches, but none now. Denies vision changes or epigastric pain. Has recently had new severe peripheral edema  Denies contractions, leakage of fluid or vaginal bleeding. Good fetal movement.   Plans primary C-section with this pregnancy because of shoulder dystocia with last baby.  Past Medical History: Past Medical History  Diagnosis Date  . Headache(784.0)     migraines  . Skin lesions 01/2014    chest, right axilla, abd., back  . Pregnancy induced hypertension     Past obstetric history: OB History  Gravida Para Term Preterm AB SAB TAB Ectopic Multiple Living  2 1 1       1     # Outcome Date GA Lbr Len/2nd Weight Sex Delivery Anes PTL Lv  2 Current           1 Term 02/14/13 [redacted]w[redacted]d / 01:57 9 lb 6.3 oz (4.26 kg) M Vag-Spont EPI  Y     Comments: LGA, poor perfusion, grunting loudly, subcostal retractions      Past Surgical History: Past Surgical History  Procedure Laterality Date  . Tonsillectomy    . Eye surgery    . Knee arthroscopy    . Cholesteatoma excision    . Cholecystectomy    . Melanoma excision    . Dilation and evacuation N/A 01/01/2014    Procedure: DILATATION AND EVACUATION;  Surgeon: Margarette Asal, MD;  Location: [facility];  Service: Gynecology;  Laterality: N/A;  . Nevus excision  08/07/2004    multiple  . Lesion removal N/A 02/02/2014    Procedure: MINOR EXICISION OF LESION X3  RIGHT AXILLA, CHEST AND ABDOMEN;  Surgeon: Alphonsa Overall, MD;  Location: [facility];  Service: General;  Laterality: N/A;     Family History: Family History  Problem Relation Age of Onset  . Hypertension Mother   . Thyroid disease Mother    . Cancer Mother     melanoma  . Hypertension Maternal Uncle   . Heart disease Maternal Uncle   . Cancer Maternal Grandfather     lung prostate  . Cancer Father     Social History: Social History  Substance Use Topics  . Smoking status: Never Smoker   . Smokeless tobacco: Never Used  . Alcohol Use: No    Allergies:  Allergies  Allergen Reactions  . Bee Venom Swelling  . Shrimp [Shellfish Allergy] Swelling    FACIAL SWELLING  . Betadine [Povidone Iodine] Swelling  . Vicodin [Hydrocodone-Acetaminophen] Itching    Pt states that she takes percocet without reaction    Meds:  Prescriptions prior to admission  Medication Sig Dispense Refill Last Dose  . acetaminophen (TYLENOL) 500 MG tablet Take 1,000 mg by mouth every 6 (six) hours as needed.   Past Week at Unknown time  . butalbital-acetaminophen-caffeine (FIORICET, ESGIC) 50-325-40 MG tablet Take 2 tablets by mouth every 6 (six) hours as needed for headache. 14 tablet 0 Past Week at Unknown time  . ferrous sulfate 325 (65 FE) MG tablet Take 325 mg by mouth daily with breakfast.   01/30/2015 at Unknown time  . Prenatal Vit-Fe Fumarate-FA (PRENATAL MULTIVITAMIN) TABS tablet Take 2 tablets by mouth daily at 12 noon.  01/30/2015 at Unknown time  . EPINEPHrine (EPIPEN 2-PAK) 0.3 mg/0.3 mL IJ SOAJ injection Inject 0.3 mg into the muscle once.   rescue    I have reviewed patient's Past Medical Hx, Surgical Hx, Family Hx, Social Hx, medications and allergies.   ROS:  Review of Systems  Eyes: Negative for visual disturbance.  Gastrointestinal: Negative for abdominal pain.  Genitourinary: Negative for vaginal bleeding.       Negative for leaking of fluid.  Neurological: Positive for headaches (intermittent, but none now).    Physical Exam   Patient Vitals for the past 24 hrs:  BP Temp Pulse Resp Height Weight  01/31/15 1312 177/81 mmHg 98.7 F (37.1 C) 88 18 5\' 4"  (1.626 m) 223 lb (101.152 kg)   Results for orders placed  or performed during the hospital encounter of 01/31/15 (from the past 24 hour(s))  Urinalysis, Routine w reflex microscopic (not at [facility])     Status: Abnormal   Collection Time: 01/31/15  1:15 PM  Result Value Ref Range   Color, Urine YELLOW YELLOW   APPearance CLEAR CLEAR   Specific Gravity, Urine <1.005 (L) 1.005 - 1.030   pH 7.0 5.0 - 8.0   Glucose, UA NEGATIVE NEGATIVE mg/dL   Hgb urine dipstick TRACE (A) NEGATIVE   Bilirubin Urine NEGATIVE NEGATIVE   Ketones, ur NEGATIVE NEGATIVE mg/dL   Protein, ur NEGATIVE NEGATIVE mg/dL   Urobilinogen, UA 0.2 0.0 - 1.0 mg/dL   Nitrite NEGATIVE NEGATIVE   Leukocytes, UA TRACE (A) NEGATIVE  Protein / creatinine ratio, urine     Status: Abnormal   Collection Time: 01/31/15  1:15 PM  Result Value Ref Range   Creatinine, Urine 60.00 mg/dL   Total Protein, Urine 10 mg/dL   Protein Creatinine Ratio 0.17 (H) 0.00 - 0.15 mg/mg[Cre]  Urine microscopic-add on     Status: None   Collection Time: 01/31/15  1:15 PM  Result Value Ref Range   Squamous Epithelial / LPF RARE RARE   WBC, UA 0-2 <3 WBC/hpf  CBC     Status: Abnormal   Collection Time: 01/31/15  1:35 PM  Result Value Ref Range   WBC 12.9 (H) 4.0 - 10.5 K/uL   RBC 3.84 (L) 3.87 - 5.11 MIL/uL   Hemoglobin 10.5 (L) 12.0 - 15.0 g/dL   HCT 32.4 (L) 36.0 - 46.0 %   MCV 84.4 78.0 - 100.0 fL   MCH 27.3 26.0 - 34.0 pg   MCHC 32.4 30.0 - 36.0 g/dL   RDW 15.1 11.5 - 15.5 %   Platelets 231 150 - 400 K/uL  Comprehensive metabolic panel     Status: Abnormal   Collection Time: 01/31/15  1:52 PM  Result Value Ref Range   Sodium 139 135 - 145 mmol/L   Potassium 3.9 3.5 - 5.1 mmol/L   Chloride 108 101 - 111 mmol/L   CO2 25 22 - 32 mmol/L   Glucose, Bld 75 65 - 99 mg/dL   BUN <5 (L) 6 - 20 mg/dL   Creatinine, Ser 0.53 0.44 - 1.00 mg/dL   Calcium 8.4 (L) 8.9 - 10.3 mg/dL   Total Protein 6.1 (L) 6.5 - 8.1 g/dL   Albumin 2.9 (L) 3.5 - 5.0 g/dL   AST 17 15 - 41 U/L   ALT 9 (L) 14 - 54 U/L   Alkaline  Phosphatase 104 38 - 126 U/L   Total Bilirubin 0.3 0.3 - 1.2 mg/dL   GFR calc non Af Amer >60 >60  mL/min   GFR calc Af Amer >60 >60 mL/min   Anion gap 6 5 - 15   Constitutional: Well-developed, well-nourished female in no acute distress.  Cardiovascular: normal rate Respiratory: normal effort GI: Abd soft, non-tender, gravid appropriate for gestational age.  MS: Extremities nontender, 3+ edema, normal ROM Neurologic: Alert and oriented x 4. Deep tendon reflexes 1+. No clonus GU: Deferred    FHT:  Baseline 150 , moderate variability, accelerations present, no decelerations Contractions: none   Labs: Results for orders placed or performed during the hospital encounter of 01/31/15 (from the past 24 hour(s))  CBC     Status: Abnormal   Collection Time: 01/31/15  1:35 PM  Result Value Ref Range   WBC 12.9 (H) 4.0 - 10.5 K/uL   RBC 3.84 (L) 3.87 - 5.11 MIL/uL   Hemoglobin 10.5 (L) 12.0 - 15.0 g/dL   HCT 32.4 (L) 36.0 - 46.0 %   MCV 84.4 78.0 - 100.0 fL   MCH 27.3 26.0 - 34.0 pg   MCHC 32.4 30.0 - 36.0 g/dL   RDW 15.1 11.5 - 15.5 %   Platelets 231 150 - 400 K/uL    Imaging:  NA  MAU Course: CBC, CMP, UA, protein creatinine ratio, NST, nothing by mouth, IV, labetalol when necessary for severe blood pressures.  Report given to Yvonne Kendall, CNM at 1:45 PM. Labs pending. 01/31/15 1401  --  84  --  --  131/80 mmHg  --  --  --  -- LP     01/31/15 1400  --  86  --  --   122/106 mmHg  --  --  --  -- LP    01/31/15 1345  --  85  --  --  137/79 mmHg  --  --  --  -- LP    01/31/15 1331  --  84  --  --  140/83 mmHg  --  --  --  -- LP    01/31/15 1328  --  86  --  --  129/85 mmHg  --  --  --  -- LP    01/31/15 1312  98.7 F (37.1 C)  88  --  18  177/81 mmHg  --  --  --  101.152 kg (223 lb) GM          MDM: Spoke with Dr Matthew Saras and the plan will be to receive betamethasone series and start Labetolol 200mg  BID. Pt will not return to work at this point. FHR- reactive Cat 1 A:  Gestational Hypertension P: Discharge to home [place], [place] 01/31/2015 1:47 PM

## 2015-01-31 NOTE — MAU Note (Signed)
Pt presents to MAU for repeat PIH labs. Denies any pain.

## 2015-01-31 NOTE — Discharge Instructions (Signed)
Hypertension During Pregnancy Hypertension is also called high blood pressure. Blood pressure moves blood in your body. Sometimes, the force that moves the blood becomes too strong. When you are pregnant, this condition should be watched carefully. It can cause problems for you and your baby. HOME CARE   Make and keep all of your doctor visits.  Take medicine as told by your doctor. Tell your doctor about all medicines you take.  Eat very little salt.  Exercise regularly.  Do not drink alcohol.  Do not smoke.  Do not have drinks with caffeine.  Lie on your left side when resting.  Your health care provider may ask you to take one low-dose aspirin (81mg ) each day. GET HELP RIGHT AWAY IF:  You have bad belly (abdominal) pain.  You have sudden puffiness (swelling) in the hands, ankles, or face.  You gain 4 pounds (1.8 kilograms) or more in 1 week.  You throw up (vomit) repeatedly.  You have bleeding from the vagina.  You do not feel the baby moving as much.  You have a headache.  You have blurred or double vision.  You have muscle twitching or spasms.  You have shortness of breath.  You have blue fingernails and lips.  You have blood in your pee (urine). MAKE SURE YOU:  Understand these instructions.  Will watch your condition.  Will get help right away if you are not doing well or get worse.   This information is not intended to replace advice given to you by your health care provider. Make sure you discuss any questions you have with your health care provider.   Document Released: 04/25/2010 Document Revised: 04/13/2014 Document Reviewed: 10/20/2012 Elsevier Interactive Patient Education [employer].

## 2015-02-01 ENCOUNTER — Inpatient Hospital Stay (HOSPITAL_COMMUNITY)
Admission: AD | Admit: 2015-02-01 | Discharge: 2015-02-01 | Disposition: A | Discharge status: Home / Self Care | Payer: Commercial Managed Care - HMO | Source: Ambulatory Visit | Attending: Obstetrics and Gynecology | Admitting: Obstetrics and Gynecology

## 2015-02-01 DIAGNOSIS — Z3A35 35 weeks gestation of pregnancy: Secondary | ICD-10-CM | POA: Diagnosis not present

## 2015-02-01 MED ORDER — BETAMETHASONE SOD PHOS & ACET 6 (3-3) MG/ML IJ SUSP
12.0000 mg | Freq: Once | INTRAMUSCULAR | Status: AC
  Administered 2015-02-01: 12 mg via INTRAMUSCULAR
  Filled 2015-02-01: qty 2

## 2015-02-05 ENCOUNTER — Encounter (HOSPITAL_COMMUNITY)
Admission: AD | Disposition: A | Discharge status: Home / Self Care | Payer: Self-pay | Source: Ambulatory Visit | Attending: Obstetrics and Gynecology

## 2015-02-05 ENCOUNTER — Observation Stay (HOSPITAL_COMMUNITY): Payer: Commercial Managed Care - HMO | Admitting: Anesthesiology

## 2015-02-05 ENCOUNTER — Ambulatory Visit (HOSPITAL_COMMUNITY)
Admit: 2015-02-05 | Discharge: 2015-02-05 | Disposition: A | Discharge status: Home / Self Care | Payer: Commercial Managed Care - HMO | Attending: Obstetrics & Gynecology | Admitting: Obstetrics & Gynecology

## 2015-02-05 ENCOUNTER — Inpatient Hospital Stay (HOSPITAL_COMMUNITY)
Admission: AD | Admit: 2015-02-05 | Discharge: 2015-02-09 | DRG: 766 | Disposition: A | Discharge status: Home / Self Care | Payer: Commercial Managed Care - HMO | Source: Ambulatory Visit | Attending: Obstetrics and Gynecology | Admitting: Obstetrics and Gynecology

## 2015-02-05 ENCOUNTER — Encounter (HOSPITAL_COMMUNITY): Payer: Self-pay | Admitting: *Deleted

## 2015-02-05 DIAGNOSIS — D649 Anemia, unspecified: Secondary | ICD-10-CM | POA: Diagnosis present

## 2015-02-05 DIAGNOSIS — Z302 Encounter for sterilization: Secondary | ICD-10-CM

## 2015-02-05 DIAGNOSIS — Z8249 Family history of ischemic heart disease and other diseases of the circulatory system: Secondary | ICD-10-CM | POA: Diagnosis not present

## 2015-02-05 DIAGNOSIS — Z3A35 35 weeks gestation of pregnancy: Secondary | ICD-10-CM | POA: Diagnosis not present

## 2015-02-05 DIAGNOSIS — O1414 Severe pre-eclampsia complicating childbirth: Principal | ICD-10-CM | POA: Diagnosis present

## 2015-02-05 DIAGNOSIS — O1490 Unspecified pre-eclampsia, unspecified trimester: Secondary | ICD-10-CM | POA: Diagnosis present

## 2015-02-05 DIAGNOSIS — D225 Melanocytic nevi of trunk: Secondary | ICD-10-CM

## 2015-02-05 DIAGNOSIS — O9081 Anemia of the puerperium: Secondary | ICD-10-CM | POA: Diagnosis present

## 2015-02-05 DIAGNOSIS — O1493 Unspecified pre-eclampsia, third trimester: Secondary | ICD-10-CM

## 2015-02-05 DIAGNOSIS — R51 Headache: Secondary | ICD-10-CM | POA: Diagnosis present

## 2015-02-05 DIAGNOSIS — O133 Gestational [pregnancy-induced] hypertension without significant proteinuria, third trimester: Secondary | ICD-10-CM | POA: Diagnosis present

## 2015-02-05 DIAGNOSIS — O1413 Severe pre-eclampsia, third trimester: Secondary | ICD-10-CM

## 2015-02-05 LAB — COMPREHENSIVE METABOLIC PANEL
ALT: 10 U/L — AB (ref 14–54)
AST: 15 U/L (ref 15–41)
Albumin: 2.8 g/dL — ABNORMAL LOW (ref 3.5–5.0)
Alkaline Phosphatase: 103 U/L (ref 38–126)
Anion gap: 7 (ref 5–15)
BILIRUBIN TOTAL: 0.3 mg/dL (ref 0.3–1.2)
CALCIUM: 8.8 mg/dL — AB (ref 8.9–10.3)
CO2: 25 mmol/L (ref 22–32)
CREATININE: 0.58 mg/dL (ref 0.44–1.00)
Chloride: 107 mmol/L (ref 101–111)
GFR calc Af Amer: >60 mL/min (ref >60)
Glucose, Bld: 95 mg/dL (ref 65–99)
Potassium: 3.1 mmol/L — ABNORMAL LOW (ref 3.5–5.1)
Sodium: 139 mmol/L (ref 135–145)
TOTAL PROTEIN: 5.9 g/dL — AB (ref 6.5–8.1)

## 2015-02-05 LAB — CBC
HEMATOCRIT: 29.7 % — AB (ref 36.0–46.0)
Hemoglobin: 9.7 g/dL — ABNORMAL LOW (ref 12.0–15.0)
MCH: 28 pg (ref 26.0–34.0)
MCHC: 32.7 g/dL (ref 30.0–36.0)
MCV: 85.6 fL (ref 78.0–100.0)
Platelets: 212 10*3/uL (ref 150–400)
RBC: 3.47 MIL/uL — AB (ref 3.87–5.11)
RDW: 15.5 % (ref 11.5–15.5)
WBC: 11.3 10*3/uL — AB (ref 4.0–10.5)

## 2015-02-05 LAB — URINALYSIS, ROUTINE W REFLEX MICROSCOPIC
Bilirubin Urine: NEGATIVE
GLUCOSE, UA: NEGATIVE mg/dL
Ketones, ur: NEGATIVE mg/dL
Nitrite: NEGATIVE
PH: 6 (ref 5.0–8.0)
PROTEIN: NEGATIVE mg/dL
Specific Gravity, Urine: <1.005 — ABNORMAL LOW (ref 1.005–1.030)
Urobilinogen, UA: 0.2 mg/dL (ref 0.0–1.0)

## 2015-02-05 LAB — URINE MICROSCOPIC-ADD ON

## 2015-02-05 LAB — PROTEIN / CREATININE RATIO, URINE
CREATININE, URINE: 24 mg/dL
Total Protein, Urine: <6 mg/dL

## 2015-02-05 LAB — TYPE AND SCREEN
ABO/RH(D): A POS
ANTIBODY SCREEN: NEGATIVE

## 2015-02-05 SURGERY — Surgical Case
Anesthesia: Spinal | Site: Uterus | Laterality: Bilateral

## 2015-02-05 MED ORDER — MENTHOL 3 MG MT LOZG
1.0000 | LOZENGE | OROMUCOSAL | Status: DC | PRN
  Filled 2015-02-05: qty 9

## 2015-02-05 MED ORDER — SODIUM CHLORIDE 0.9 % IJ SOLN: 3.0000 mL | INTRAMUSCULAR | Status: DC | PRN

## 2015-02-05 MED ORDER — BUPIVACAINE IN DEXTROSE 0.75-8.25 % IT SOLN
INTRATHECAL | Status: DC | PRN
  Administered 2015-02-05: 1.4 mL via INTRATHECAL

## 2015-02-05 MED ORDER — ONDANSETRON HCL 4 MG/2ML IJ SOLN
INTRAMUSCULAR | Status: AC
  Filled 2015-02-05: qty 2

## 2015-02-05 MED ORDER — LACTATED RINGERS IV SOLN: INTRAVENOUS | Status: DC

## 2015-02-05 MED ORDER — LABETALOL HCL 100 MG PO TABS
200.0000 mg | ORAL_TABLET | Freq: Two times a day (BID) | ORAL | Status: DC
  Administered 2015-02-05: 200 mg via ORAL
  Filled 2015-02-05: qty 1
  Filled 2015-02-05: qty 2
  Filled 2015-02-05 (×3): qty 1

## 2015-02-05 MED ORDER — ZOLPIDEM TARTRATE 5 MG PO TABS: 5.0000 mg | ORAL_TABLET | Freq: Every evening | ORAL | Status: DC | PRN

## 2015-02-05 MED ORDER — NALOXONE HCL 2 MG/2ML IJ SOSY: 1.0000 ug/kg/h | PREFILLED_SYRINGE | INTRAVENOUS | Status: DC | PRN

## 2015-02-05 MED ORDER — SODIUM CHLORIDE 0.9 % IV SOLN: 250.0000 mL | INTRAVENOUS | Status: DC

## 2015-02-05 MED ORDER — MEPERIDINE HCL 25 MG/ML IJ SOLN: 6.2500 mg | INTRAMUSCULAR | Status: DC | PRN

## 2015-02-05 MED ORDER — SENNOSIDES-DOCUSATE SODIUM 8.6-50 MG PO TABS
2.0000 | ORAL_TABLET | ORAL | Status: DC
  Administered 2015-02-06 – 2015-02-08 (×4): 2 via ORAL
  Filled 2015-02-05 (×4): qty 2

## 2015-02-05 MED ORDER — BISACODYL 10 MG RE SUPP
10.0000 mg | Freq: Every day | RECTAL | Status: DC | PRN
  Filled 2015-02-05: qty 1

## 2015-02-05 MED ORDER — OXYTOCIN 10 UNIT/ML IJ SOLN
INTRAMUSCULAR | Status: AC
  Filled 2015-02-05: qty 4

## 2015-02-05 MED ORDER — ONDANSETRON 8 MG PO TBDP
8.0000 mg | ORAL_TABLET | Freq: Once | ORAL | Status: AC
  Administered 2015-02-05: 8 mg via ORAL
  Filled 2015-02-05: qty 1

## 2015-02-05 MED ORDER — NALOXONE HCL 0.4 MG/ML IJ SOLN: 0.4000 mg | INTRAMUSCULAR | Status: DC | PRN

## 2015-02-05 MED ORDER — SCOPOLAMINE 1 MG/3DAYS TD PT72: 1.0000 | MEDICATED_PATCH | Freq: Once | TRANSDERMAL | Status: DC

## 2015-02-05 MED ORDER — NALBUPHINE HCL 10 MG/ML IJ SOLN
INTRAMUSCULAR | Status: AC
  Filled 2015-02-05: qty 1

## 2015-02-05 MED ORDER — SODIUM CHLORIDE 0.9 % IR SOLN
Status: DC | PRN
  Administered 2015-02-05: 1000 mL

## 2015-02-05 MED ORDER — CALCIUM CARBONATE ANTACID 500 MG PO CHEW
2.0000 | CHEWABLE_TABLET | ORAL | Status: DC | PRN
  Filled 2015-02-05: qty 2

## 2015-02-05 MED ORDER — TETANUS-DIPHTH-ACELL PERTUSSIS 5-2.5-18.5 LF-MCG/0.5 IM SUSP: 0.5000 mL | Freq: Once | INTRAMUSCULAR | Status: DC

## 2015-02-05 MED ORDER — ACETAMINOPHEN 325 MG PO TABS: 650.0000 mg | ORAL_TABLET | ORAL | Status: DC | PRN

## 2015-02-05 MED ORDER — OXYCODONE-ACETAMINOPHEN 5-325 MG PO TABS
2.0000 | ORAL_TABLET | ORAL | Status: DC | PRN
  Filled 2015-02-05: qty 2

## 2015-02-05 MED ORDER — LACTATED RINGERS IV SOLN
40.0000 [IU] | INTRAVENOUS | Status: DC | PRN
  Administered 2015-02-05: 40 [IU] via INTRAVENOUS

## 2015-02-05 MED ORDER — MAGNESIUM SULFATE 50 % IJ SOLN
2.0000 g/h | INTRAVENOUS | Status: DC
  Filled 2015-02-05 (×2): qty 80

## 2015-02-05 MED ORDER — LACTATED RINGERS IV SOLN
INTRAVENOUS | Status: DC | PRN
  Administered 2015-02-05: 13:00:00 via INTRAVENOUS

## 2015-02-05 MED ORDER — KETOROLAC TROMETHAMINE 30 MG/ML IJ SOLN
30.0000 mg | Freq: Four times a day (QID) | INTRAMUSCULAR | Status: AC | PRN
  Filled 2015-02-05: qty 1

## 2015-02-05 MED ORDER — DIBUCAINE 1 % RE OINT: 1.0000 "application " | TOPICAL_OINTMENT | RECTAL | Status: DC | PRN

## 2015-02-05 MED ORDER — MAGNESIUM SULFATE BOLUS VIA INFUSION
4.0000 g | Freq: Once | INTRAVENOUS | Status: AC
  Administered 2015-02-05: 4 g via INTRAVENOUS
  Filled 2015-02-05: qty 500

## 2015-02-05 MED ORDER — PHENYLEPHRINE 8 MG IN D5W 100 ML (0.08MG/ML) PREMIX OPTIME
INJECTION | INTRAVENOUS | Status: DC | PRN
  Administered 2015-02-05: 30 ug/min via INTRAVENOUS

## 2015-02-05 MED ORDER — LABETALOL HCL 100 MG PO TABS
200.0000 mg | ORAL_TABLET | Freq: Two times a day (BID) | ORAL | Status: DC
  Administered 2015-02-05: 200 mg via ORAL
  Filled 2015-02-05: qty 2

## 2015-02-05 MED ORDER — KETOROLAC TROMETHAMINE 30 MG/ML IJ SOLN
INTRAMUSCULAR | Status: AC
  Filled 2015-02-05: qty 1

## 2015-02-05 MED ORDER — ONDANSETRON HCL 4 MG/2ML IJ SOLN
4.0000 mg | Freq: Three times a day (TID) | INTRAMUSCULAR | Status: DC | PRN
  Administered 2015-02-05: 4 mg via INTRAVENOUS
  Filled 2015-02-05: qty 2

## 2015-02-05 MED ORDER — MORPHINE SULFATE (PF) 0.5 MG/ML IJ SOLN
INTRAMUSCULAR | Status: DC | PRN
  Administered 2015-02-05: .2 mg via INTRATHECAL

## 2015-02-05 MED ORDER — WITCH HAZEL-GLYCERIN EX PADS: 1.0000 "application " | MEDICATED_PAD | CUTANEOUS | Status: DC | PRN

## 2015-02-05 MED ORDER — SIMETHICONE 80 MG PO CHEW
80.0000 mg | CHEWABLE_TABLET | Freq: Three times a day (TID) | ORAL | Status: DC
  Administered 2015-02-06 – 2015-02-08 (×8): 80 mg via ORAL
  Filled 2015-02-05 (×8): qty 1

## 2015-02-05 MED ORDER — PRENATAL MULTIVITAMIN CH
1.0000 | ORAL_TABLET | Freq: Every day | ORAL | Status: DC
  Administered 2015-02-06 – 2015-02-08 (×3): 1 via ORAL
  Filled 2015-02-05 (×3): qty 1

## 2015-02-05 MED ORDER — OXYCODONE-ACETAMINOPHEN 5-325 MG PO TABS
1.0000 | ORAL_TABLET | ORAL | Status: DC | PRN
  Administered 2015-02-06 – 2015-02-09 (×8): 1 via ORAL
  Filled 2015-02-05 (×8): qty 1

## 2015-02-05 MED ORDER — SODIUM CHLORIDE 0.9 % IV SOLN: 250.0000 mL | INTRAVENOUS | Status: DC | PRN

## 2015-02-05 MED ORDER — LANOLIN HYDROUS EX OINT: 1.0000 "application " | TOPICAL_OINTMENT | CUTANEOUS | Status: DC | PRN

## 2015-02-05 MED ORDER — FENTANYL CITRATE (PF) 100 MCG/2ML IJ SOLN
INTRAMUSCULAR | Status: DC | PRN
  Administered 2015-02-05: 10 ug via INTRATHECAL

## 2015-02-05 MED ORDER — KETOROLAC TROMETHAMINE 30 MG/ML IJ SOLN
30.0000 mg | Freq: Four times a day (QID) | INTRAMUSCULAR | Status: AC | PRN
  Administered 2015-02-05: 30 mg via INTRAMUSCULAR
  Filled 2015-02-05: qty 1

## 2015-02-05 MED ORDER — PROMETHAZINE HCL 25 MG/ML IJ SOLN: 6.2500 mg | INTRAMUSCULAR | Status: DC | PRN

## 2015-02-05 MED ORDER — PHENYLEPHRINE 8 MG IN D5W 100 ML (0.08MG/ML) PREMIX OPTIME
INJECTION | INTRAVENOUS | Status: AC
  Filled 2015-02-05: qty 100

## 2015-02-05 MED ORDER — MORPHINE SULFATE (PF) 0.5 MG/ML IJ SOLN
INTRAMUSCULAR | Status: AC
  Filled 2015-02-05: qty 100

## 2015-02-05 MED ORDER — FENTANYL CITRATE (PF) 100 MCG/2ML IJ SOLN
25.0000 ug | INTRAMUSCULAR | Status: DC | PRN
  Administered 2015-02-05: 25 ug via INTRAVENOUS
  Administered 2015-02-05: 50 ug via INTRAVENOUS
  Administered 2015-02-05: 25 ug via INTRAVENOUS
  Administered 2015-02-06: 50 ug via INTRAVENOUS
  Filled 2015-02-05 (×2): qty 2

## 2015-02-05 MED ORDER — LACTATED RINGERS IV SOLN
INTRAVENOUS | Status: DC
  Administered 2015-02-05: 06:00:00 via INTRAVENOUS

## 2015-02-05 MED ORDER — HYDROMORPHONE HCL 1 MG/ML IJ SOLN
2.0000 mg | Freq: Four times a day (QID) | INTRAMUSCULAR | Status: DC | PRN
Start: 2015-02-05 — End: 2015-02-05
  Administered 2015-02-05: 2 mg via INTRAVENOUS
  Filled 2015-02-05: qty 2

## 2015-02-05 MED ORDER — CITRIC ACID-SODIUM CITRATE 334-500 MG/5ML PO SOLN
ORAL | Status: AC
  Filled 2015-02-05: qty 15

## 2015-02-05 MED ORDER — PRENATAL MULTIVITAMIN CH
1.0000 | ORAL_TABLET | Freq: Every day | ORAL | Status: DC
  Filled 2015-02-05: qty 1

## 2015-02-05 MED ORDER — FLEET ENEMA 7-19 GM/118ML RE ENEM: 1.0000 | ENEMA | Freq: Every day | RECTAL | Status: DC | PRN

## 2015-02-05 MED ORDER — DOCUSATE SODIUM 100 MG PO CAPS
100.0000 mg | ORAL_CAPSULE | Freq: Every day | ORAL | Status: DC
  Administered 2015-02-05: 100 mg via ORAL
  Filled 2015-02-05 (×2): qty 1

## 2015-02-05 MED ORDER — OXYTOCIN 40 UNITS IN LACTATED RINGERS INFUSION - SIMPLE MED: 62.5000 mL/h | INTRAVENOUS | Status: AC

## 2015-02-05 MED ORDER — ONDANSETRON HCL 4 MG/2ML IJ SOLN
INTRAMUSCULAR | Status: DC | PRN
  Administered 2015-02-05: 4 mg via INTRAVENOUS

## 2015-02-05 MED ORDER — FENTANYL CITRATE (PF) 100 MCG/2ML IJ SOLN: 25.0000 ug | INTRAMUSCULAR | Status: DC | PRN

## 2015-02-05 MED ORDER — SIMETHICONE 80 MG PO CHEW
80.0000 mg | CHEWABLE_TABLET | ORAL | Status: DC
  Administered 2015-02-06 – 2015-02-08 (×4): 80 mg via ORAL
  Filled 2015-02-05 (×4): qty 1

## 2015-02-05 MED ORDER — MEASLES, MUMPS & RUBELLA VAC ~~LOC~~ INJ: 0.5000 mL | INJECTION | Freq: Once | SUBCUTANEOUS | Status: DC

## 2015-02-05 MED ORDER — DIPHENHYDRAMINE HCL 25 MG PO CAPS: 25.0000 mg | ORAL_CAPSULE | Freq: Four times a day (QID) | ORAL | Status: DC | PRN

## 2015-02-05 MED ORDER — SODIUM CHLORIDE 0.9 % IJ SOLN
3.0000 mL | Freq: Two times a day (BID) | INTRAMUSCULAR | Status: DC
  Administered 2015-02-06 – 2015-02-08 (×5): 3 mL via INTRAVENOUS

## 2015-02-05 MED ORDER — FENTANYL CITRATE (PF) 100 MCG/2ML IJ SOLN
INTRAMUSCULAR | Status: AC
  Filled 2015-02-05: qty 4

## 2015-02-05 MED ORDER — SIMETHICONE 80 MG PO CHEW: 80.0000 mg | CHEWABLE_TABLET | ORAL | Status: DC | PRN

## 2015-02-05 MED ORDER — BUTALBITAL-APAP-CAFFEINE 50-325-40 MG PO TABS
2.0000 | ORAL_TABLET | Freq: Once | ORAL | Status: AC
  Administered 2015-02-05: 2 via ORAL
  Filled 2015-02-05: qty 2

## 2015-02-05 MED ORDER — IBUPROFEN 800 MG PO TABS
800.0000 mg | ORAL_TABLET | Freq: Three times a day (TID) | ORAL | Status: DC | PRN
  Administered 2015-02-06 – 2015-02-09 (×8): 800 mg via ORAL
  Filled 2015-02-05 (×10): qty 1

## 2015-02-05 MED ORDER — SODIUM CHLORIDE 0.9 % IJ SOLN: 3.0000 mL | Freq: Two times a day (BID) | INTRAMUSCULAR | Status: DC

## 2015-02-05 MED ORDER — DEXTROSE 5 % IV SOLN
2.0000 g | INTRAVENOUS | Status: DC
  Filled 2015-02-05: qty 2

## 2015-02-05 MED ORDER — MORPHINE SULFATE (PF) 10 MG/ML IV SOLN
5.0000 mg | Freq: Once | INTRAVENOUS | Status: DC
  Filled 2015-02-05: qty 1

## 2015-02-05 MED ORDER — CITRIC ACID-SODIUM CITRATE 334-500 MG/5ML PO SOLN
30.0000 mL | Freq: Once | ORAL | Status: AC
  Administered 2015-02-05: 30 mL via ORAL

## 2015-02-05 MED ORDER — NALBUPHINE HCL 10 MG/ML IJ SOLN
10.0000 mg | INTRAMUSCULAR | Status: DC | PRN
  Administered 2015-02-05: 10 mg via INTRAVENOUS

## 2015-02-05 SURGICAL SUPPLY — 28 items
APL SKNCLS STERI-STRIP NONHPOA (GAUZE/BANDAGES/DRESSINGS) ×2
BENZOIN TINCTURE PRP APPL 2/3 (GAUZE/BANDAGES/DRESSINGS) ×2 IMPLANT
CLAMP CORD UMBIL (MISCELLANEOUS) IMPLANT
CLOTH BEACON ORANGE TIMEOUT ST (SAFETY) ×3 IMPLANT
DRAPE SHEET LG 3/4 BI-LAMINATE (DRAPES) IMPLANT
DRSG OPSITE POSTOP 4X10 (GAUZE/BANDAGES/DRESSINGS) ×3 IMPLANT
DURAPREP 26ML APPLICATOR (WOUND CARE) ×3 IMPLANT
ELECT LIGASURE SHORT 9 REUSE (ELECTRODE) ×4 IMPLANT
ELECT REM PT RETURN 9FT ADLT (ELECTROSURGICAL) ×3
ELECTRODE REM PT RTRN 9FT ADLT (ELECTROSURGICAL) ×2 IMPLANT
EXTRACTOR VACUUM M CUP 4 TUBE (SUCTIONS) IMPLANT
GLOVE BIO SURGEON STRL SZ7 (GLOVE) ×3 IMPLANT
GOWN STRL REUS W/TWL LRG LVL3 (GOWN DISPOSABLE) ×6 IMPLANT
KIT ABG SYR 3ML LUER SLIP (SYRINGE) IMPLANT
NDL HYPO 25X5/8 SAFETYGLIDE (NEEDLE) ×1 IMPLANT
NEEDLE HYPO 25X5/8 SAFETYGLIDE (NEEDLE) ×3 IMPLANT
NS IRRIG 1000ML POUR BTL (IV SOLUTION) ×3 IMPLANT
PACK C SECTION WH (CUSTOM PROCEDURE TRAY) ×3 IMPLANT
PAD OB MATERNITY 4.3X12.25 (PERSONAL CARE ITEMS) ×3 IMPLANT
PENCIL SMOKE EVAC W/HOLSTER (ELECTROSURGICAL) ×3 IMPLANT
STRIP CLOSURE SKIN 1/2X4 (GAUZE/BANDAGES/DRESSINGS) ×3 IMPLANT
SUT CHROMIC 0 CTX 36 (SUTURE) ×9 IMPLANT
SUT MON AB 4-0 PS1 27 (SUTURE) ×3 IMPLANT
SUT PDS AB 0 CT1 27 (SUTURE) ×6 IMPLANT
SUT VIC AB 3-0 CT1 27 (SUTURE) ×6
SUT VIC AB 3-0 CT1 TAPERPNT 27 (SUTURE) ×4 IMPLANT
TOWEL OR 17X24 6PK STRL BLUE (TOWEL DISPOSABLE) ×3 IMPLANT
TRAY FOLEY CATH SILVER 14FR (SET/KITS/TRAYS/PACK) ×3 IMPLANT

## 2015-02-05 NOTE — Consult Note (Addendum)
MFM consult, staff note  I reviewed hypertension and gestational hypertension as a cause of uteroplacental insufficiency, with increased risk of IUGR, oligohydramnios, and stillbirth. Preeclampsia also places her at increased risk for renal failure, CVA, MI, and maternal death. Lastly, hypertension (severe range) increases the risk of placental abruption, especially in the setting of superimposed preeclampsia/GHTN (ie, managed the same due to similar risk profile).  I reviewed the essential tenets in the most recent guidelines for management of hypertension in pregnancy, namely, severe gestational hypertension in accordance with the [place] of Obstetrics and Gynecology expert opinion:  Given that she has persistently elevated blood pressures and persistent headache, visual changes, and RUQ pain at past [redacted] weeks gestation (now at [facility]), I recommend delivering this patient and recommend management as a severe preeclampsia/GHTN, warranting delivery with intrapartum magnesium sulfate until 24 hours postpartum.  Summary of Recommendations: 1. I recommend magnesium sulfate now and to be continued until 24 hours postpartum for the prevention of eclamptic seizures in accordance with the most current guidelines for management of gestational hypertension with severe features as regardless of presence/absence of proteinuria, severe features warrant administration of magnesium sulfate for the safety of both mom and baby. I have spoken directly with Dr. Lynnette Caffey.  Regardless, delivery is indicated. 2. Hence I recommend delivery given patient's persistent neurologic symptoms in presence of preeclampsia 3. I would repeat preeclampsia labs POD#1 and maintain inpatient surveillance until POD#3, noting that I would only use antihypertensive therapy in setting of persistent severe range BP. 4. I spent in excess of 20 minutes reviewing and evaluating this patient's case with greater than 50% of this time in face  to face discussion. I spoke directly with Dr. Lynnette Caffey to convey my recommendations for magnesium sulfate and delivery.   Page with questions.  Eulis Foster, MD, MS, Cherlynn June

## 2015-02-05 NOTE — Progress Notes (Signed)
C/o mild HA after having received Dilaudid.  Active FM.  NPO since 630 pm last night.  VSS.  BP nl-mild range Gen: A&O x 3 Abd: soft, NT Ext: 1+ edema, 1+DTRs, SCDs  IUP at [redacted]w[redacted]d with severe pre-eclampsia -Discussed pt with Dr. Margurite Auerbach.  He is in agreement with delivery today secondary to severe pre-e. Will mag pp. -Patient elects for primary C/S secondary to h/o shoulder dystocia last pregnancy despite understanding this babies smaller EFW.  She also wants BTL.  She is counseled re: risk of bleeding, infection, scarring, and damage to surrounding structures.  She understands the risk of ectopic pregnancy and failure with BTL.  All questions were answered and primary C/S with BTL is posted for 1pm with Dr. Matthew Saras today.  Linda Hedges, DO

## 2015-02-05 NOTE — Anesthesia Preprocedure Evaluation (Addendum)
Anesthesia Evaluation  Patient identified by MRN, date of birth, ID band Patient awake    Reviewed: Allergy & Precautions, NPO status , Patient's Chart, lab work & pertinent test results, reviewed documented beta blocker date and time   Airway        Dental   Pulmonary neg pulmonary ROS,           Cardiovascular hypertension, Pt. on medications and Pt. on home beta blockers      Neuro/Psych  Headaches, negative psych ROS   GI/Hepatic negative GI ROS,   Endo/Other  negative endocrine ROS  Renal/GU negative Renal ROS  negative genitourinary   Musculoskeletal negative musculoskeletal ROS (+)   Abdominal   Peds negative pediatric ROS (+)  Hematology negative hematology ROS (+)   Anesthesia Other Findings   Reproductive/Obstetrics (+) Pregnancy                            Lab Results  Component Value Date   WBC 11.3* 02/05/2015   HGB 9.7* 02/05/2015   HCT 29.7* 02/05/2015   MCV 85.6 02/05/2015   PLT 212 02/05/2015   No results found for: INR, PROTIME  Anesthesia Physical Anesthesia Plan  ASA: III  Anesthesia Plan: Spinal   Post-op Pain Management:    Induction:   Airway Management Planned:   Additional Equipment:   Intra-op Plan:   Post-operative Plan:   Informed Consent: I have reviewed the patients History and Physical, chart, labs and discussed the procedure including the risks, benefits and alternatives for the proposed anesthesia with the patient or authorized representative who has indicated his/her understanding and acceptance.     Plan Discussed with: CRNA and Surgeon  Anesthesia Plan Comments:        Anesthesia Quick Evaluation

## 2015-02-05 NOTE — Transfer of Care (Signed)
Immediate Anesthesia Transfer of Care Note  Patient: Barbara Scott  Procedure(s) Performed: Procedure(s): CESAREAN SECTION WITH BILATERAL TUBAL LIGATION (Bilateral)  Patient Location: PACU  Anesthesia Type:Spinal  Level of Consciousness: awake, alert  and oriented  Airway & Oxygen Therapy: Patient Spontanous Breathing  Post-op Assessment: Report given to RN and Post -op Vital signs reviewed and stable  Post vital signs: Reviewed and stable  Last Vitals:  Filed Vitals:   02/05/15 1218  BP: 126/73  Pulse: 79  Temp: 36.7 C  Resp: 20    Complications: No apparent anesthesia complications

## 2015-02-05 NOTE — Op Note (Signed)
Preoperative diagnosis:[redacted]w[redacted]d, history of fetal macrosomia with shoulder dystocia, request permanent sterilization, gestational hypertension  Postoperative diagnosis: Same  Procedure: Primary low transverse cesarean section, bilateral salpingectomy   Surgeon: Matthew Saras  Anesthesia: Spinal  EBL: 700 cc   Procedure and findings: Patient taken the operating room after an adequate level of spinal anesthetic was obtained with the patient in left tilt position, the abdomen prepped and draped in the usual fashion and Foley catheter was positioned. Appropriate timeout for taken at that point. Transverse incision made 2 finger breaths above the symphysis carried down to the fascia which was incised and extended transversely. Rectus muscles divided in the midline, peritoneum entered superiorly without incident and extended in a vertical fashion. The vesicouterine serosa was incised and bluntly and sharply dissected below, bladder blade repositioned. Transverse incision made in the lower segment extended with bandage scissors clear fluid noted. Fetus presenting vertex, the infant was suctioned cord clamped and passed the pediatric team for further care. The infant was vigorous at delivery. The sent was then delivered manually intact, uterus exteriorized, cavity wiped clean with laparotomy pack closure obtained the first layer of 0 chromic in a locked fashion followed by number cutting layer of 0 chromic. This was hemostatic as discussed preoperatively, bilateral salpingectomy was then carried out with the LigaSure device coagulating and dividing the mesosalpinx in the fimbriated end to the cornu. Thus, each tube was excised these areas were carefully checked in both noted be hemostatic both ovaries were normal.  Prior to closure sponge, needle, S precast reported as correct 2. The peritoneum was enclose a running 2-0 Vicryl suture, the same to reapproximate the rectus muscles in the midline, 0 PDS was then used  transversely to close the fascia subcutaneous tissue was irrigated noted be hemostatic was closed with a 3-0 Vicryl running suture to reapproximate the dead space a 4-0 Monocryl subcutaneous particular closure Steri-Strips and a honeycomb dressing. Clear urine noted at the end the case.   She will be recovered in a NICU for 24 hours a magnesium sulfate therapy  Dictated with [place] M.D.

## 2015-02-05 NOTE — Anesthesia Postprocedure Evaluation (Signed)
  Anesthesia Post-op Note  Patient: Barbara Scott  Procedure(s) Performed: Procedure(s): CESAREAN SECTION WITH BILATERAL TUBAL LIGATION (Bilateral)  Patient Location: PACU  Anesthesia Type:Spinal  Level of Consciousness: awake, alert  and oriented  Airway and Oxygen Therapy: Patient Spontanous Breathing  Post-op Pain: none  Post-op Assessment: Post-op Vital signs reviewed and Patient's Cardiovascular Status Stable              Post-op Vital Signs: Reviewed and stable  Last Vitals:  Filed Vitals:   02/05/15 1459  BP: 138/78  Pulse: 64  Temp: 36.5 C  Resp: 16    Complications: No apparent anesthesia complications

## 2015-02-05 NOTE — MAU Note (Signed)
Pt c/o HA since 230pm 10/31, unrelieved by Fioricet and rest. Also c/o RUQ pain x several weeks. Denies ctx, LOF, VB. +FM.

## 2015-02-05 NOTE — Anesthesia Procedure Notes (Signed)
Spinal Patient location during procedure: OR Start time: 02/05/2015 1:00 PM End time: 02/05/2015 1:02 PM Staffing Anesthesiologist: Lyn Hollingshead Performed by: anesthesiologist  Preanesthetic Checklist Completed: patient identified, surgical consent, pre-op evaluation, timeout performed, IV checked, risks and benefits discussed and monitors and equipment checked Spinal Block Patient position: sitting Prep: site prepped and draped and DuraPrep Patient monitoring: heart rate, cardiac monitor, continuous pulse ox and blood pressure Approach: midline Location: L3-4 Injection technique: single-shot Needle Needle type: Pencan  Needle gauge: 24 G Needle length: 9 cm Needle insertion depth: 5 cm Assessment Sensory level: T4

## 2015-02-05 NOTE — Progress Notes (Signed)
Took patient via stretcher to OR with antibiotic, prep done, husband at side.

## 2015-02-05 NOTE — Progress Notes (Signed)
The patient was re-examined with no change in status, requests BTL>>proced + permanence explained

## 2015-02-05 NOTE — MAU Provider Note (Cosign Needed)
History     CSN: [id number]  Arrival date and time: 02/05/15 0241   First Provider Initiated Contact with Patient 02/05/15 0326      No chief complaint on file.  HPI Barbara Scott is a 29yo G3P1011 @ 35.2wks who presents for eval of H/A since 1430 on 10/31 despite Fioricet and elevated BP 160/90s on home cuff. She is currently on Lab 200 BID. Having same RUQ discomfort as she has been having. Was admitted on Ante from 10/24-25 where 24hr urine was resulted normal and her H/A was relieved w/ Fioricet. BMZ just given as well. Denies leaking or bldg. No visual disturbances. Edema in feet/legs is unchanged. Also w/ nausea but no vomiting.  Plan for primary C/S this delivery due to hx of shoulder dystocia w/ prev vag delivery.  OB History    Gravida Para Term Preterm AB TAB SAB Ectopic Multiple Living   3 1 1  1  1   1       Past Medical History  Diagnosis Date  . Headache(784.0)     migraines  . Skin lesions 01/2014    chest, right axilla, abd., back  . Pregnancy induced hypertension     Past Surgical History  Procedure Laterality Date  . Tonsillectomy    . Eye surgery    . Knee arthroscopy    . Cholesteatoma excision    . Cholecystectomy    . Melanoma excision    . Dilation and evacuation N/A 01/01/2014    Procedure: DILATATION AND EVACUATION;  Surgeon: Margarette Asal, MD;  Location: [facility];  Service: Gynecology;  Laterality: N/A;  . Nevus excision  08/07/2004    multiple  . Lesion removal N/A 02/02/2014    Procedure: MINOR EXICISION OF LESION X3  RIGHT AXILLA, CHEST AND ABDOMEN;  Surgeon: Alphonsa Overall, MD;  Location: [facility];  Service: General;  Laterality: N/A;    Family History  Problem Relation Age of Onset  . Hypertension Mother   . Thyroid disease Mother   . Cancer Mother     melanoma  . Hypertension Maternal Uncle   . Heart disease Maternal Uncle   . Cancer Maternal Grandfather     lung prostate  . Cancer Father     Social History  Substance  Use Topics  . Smoking status: Never Smoker   . Smokeless tobacco: Never Used  . Alcohol Use: No    Allergies:  Allergies  Allergen Reactions  . Bee Venom Swelling  . Shrimp [Shellfish Allergy] Swelling    FACIAL SWELLING  . Betadine [Povidone Iodine] Swelling  . Vicodin [Hydrocodone-Acetaminophen] Itching    Pt states that she takes percocet without reaction    Prescriptions prior to admission  Medication Sig Dispense Refill Last Dose  . butalbital-acetaminophen-caffeine (FIORICET WITH CODEINE) 50-325-40-30 MG capsule Take 2 capsules by mouth every 4 (four) hours as needed for headache.   02/04/2015 at Unknown time  . labetalol (NORMODYNE) 100 MG tablet Take 2 tablets (200 mg total) by mouth 2 (two) times daily. 120 tablet 1 02/04/2015 at 2000    ROS All pertinents rev'd in HPI Physical Exam   Blood pressure 149/82, pulse 67, temperature 98.2 F (36.8 C), resp. rate 19, weight 103.057 kg (227 lb 3.2 oz), SpO2 100 %, unknown if currently breastfeeding.   Blood pressures: 141/83, 149/82, 127/76, 136/85, 138/82  Physical Exam  Constitutional: She is oriented to person, place, and time. She appears well-developed.  HENT:  Head: Normocephalic.  Neck:  Normal range of motion.  Cardiovascular: Normal rate.   Respiratory: Effort normal.  GI:  EFM 125-130s, +accels, no decels No ctx per toco  Musculoskeletal:  BLE w/ 3+ pitting edema  Neurological: She is alert and oriented to person, place, and time.  Skin: Skin is warm and dry.  Psychiatric: She has a normal mood and affect. Her behavior is normal. Thought content normal.   CBC    Component Value Date/Time   WBC 11.3* 02/05/2015 0325   RBC 3.47* 02/05/2015 0325   HGB 9.7* 02/05/2015 0325   HCT 29.7* 02/05/2015 0325   PLT 212 02/05/2015 0325   MCV 85.6 02/05/2015 0325   MCH 28.0 02/05/2015 0325   MCHC 32.7 02/05/2015 0325   RDW 15.5 02/05/2015 0325   CMP     Component Value Date/Time   NA 139 02/05/2015 0325   K  3.1* 02/05/2015 0325   CL 107 02/05/2015 0325   CO2 25 02/05/2015 0325   GLUCOSE 95 02/05/2015 0325   BUN <5* 02/05/2015 0325   CREATININE 0.58 02/05/2015 0325   CALCIUM 8.8* 02/05/2015 0325   PROT 5.9* 02/05/2015 0325   ALBUMIN 2.8* 02/05/2015 0325   AST 15 02/05/2015 0325   ALT 10* 02/05/2015 0325   ALKPHOS 103 02/05/2015 0325   BILITOT 0.3 02/05/2015 0325   GFRNONAA >60 02/05/2015 0325   GFRAA >60 02/05/2015 0325   Urine P/C ratio: below reportable range  MAU Course  Procedures  MDM CBC/CMET/Urine P/C ratio ordered  Assessment and Plan  IUP@35 .2wks Gest HTN Headache  Place in observation on Ante Dilaudid for pain Make plan of care in AM  [place], [facility] CNM 02/05/2015, 3:42 AM

## 2015-02-05 NOTE — H&P (Addendum)
Barbara Scott is a 29 y.o. female presenting AT 31.2 c/o headache and increased blood pressure.  One hospitalization for this.  Normal 24 hour urine. PIH labs normal.  150/94.  Also prior MAU visits with the same complaint.  For primary cesarean section. Maternal Medical History:  Fetal activity: Perceived fetal activity is normal.    Prenatal complications: PIH.   Prenatal Complications - Diabetes: none.    OB History    Gravida Para Term Preterm AB TAB SAB Ectopic Multiple Living   3 1 1  1  1   1      Past Medical History  Diagnosis Date  . Headache(784.0)     migraines  . Skin lesions 01/2014    chest, right axilla, abd., back  . Pregnancy induced hypertension    Past Surgical History  Procedure Laterality Date  . Tonsillectomy    . Eye surgery    . Knee arthroscopy    . Cholesteatoma excision    . Cholecystectomy    . Melanoma excision    . Dilation and evacuation N/A 01/01/2014    Procedure: DILATATION AND EVACUATION;  Surgeon: Margarette Asal, MD;  Location: [facility];  Service: Gynecology;  Laterality: N/A;  . Nevus excision  08/07/2004    multiple  . Lesion removal N/A 02/02/2014    Procedure: MINOR EXICISION OF LESION X3  RIGHT AXILLA, CHEST AND ABDOMEN;  Surgeon: Alphonsa Overall, MD;  Location: [facility];  Service: General;  Laterality: N/A;   Family History: family history includes Cancer in her father, maternal grandfather, and mother; Heart disease in her maternal uncle; Hypertension in her maternal uncle and mother; Thyroid disease in her mother. Social History:  reports that she has never smoked. She has never used smokeless tobacco. She reports that she does not drink alcohol or use illicit drugs.   Prenatal Transfer Tool  Maternal Diabetes: No Genetic Screening: Normal Maternal Ultrasounds/Referrals: Normal Fetal Ultrasounds or other Referrals:  None Maternal Substance Abuse:  No Significant Maternal Medications:  None Significant Maternal  Lab Results:  None Other Comments:  None  ROS    Blood pressure 144/80, pulse 59, temperature 98.1 F (36.7 C), temperature source Oral, resp. rate 20, weight 103.057 kg (227 lb 3.2 oz), SpO2 95 %, unknown if currently breastfeeding. Maternal Exam:  Abdomen: Patient reports no abdominal tenderness. Fundal height is C/W DATES.       Physical Exam  Constitutional: She is oriented to person, place, and time.  Cardiovascular: Normal rate, regular rhythm and normal heart sounds.   Respiratory: Effort normal and breath sounds normal.  GI:  GRAVID UTERUS C/W DATES  Musculoskeletal: She exhibits edema.  Neurological: She is alert and oriented to person, place, and time. She has normal reflexes.    Prenatal labs: ABO, Rh: --/--/A POS (11/01 5053) Antibody: NEG (11/01 0325) Rubella:   RPR:    HBsAg:    HIV:    GBS:     Assessment/Plan: IUP at 35.2 with gestational hypertension on labetolol Worsening headache Admit for observation.   Thad Osoria S 02/05/2015, 5:57 AM

## 2015-02-06 ENCOUNTER — Encounter (HOSPITAL_COMMUNITY): Payer: Self-pay | Admitting: Obstetrics and Gynecology

## 2015-02-06 LAB — COMPREHENSIVE METABOLIC PANEL
ALK PHOS: 84 U/L (ref 38–126)
ALT: 11 U/L — ABNORMAL LOW (ref 14–54)
ANION GAP: 3 — AB (ref 5–15)
AST: 18 U/L (ref 15–41)
Albumin: 2.2 g/dL — ABNORMAL LOW (ref 3.5–5.0)
BUN: <5 mg/dL — ABNORMAL LOW (ref 6–20)
CALCIUM: 7.1 mg/dL — AB (ref 8.9–10.3)
CO2: 27 mmol/L (ref 22–32)
Chloride: 105 mmol/L (ref 101–111)
Creatinine, Ser: 0.59 mg/dL (ref 0.44–1.00)
GFR calc non Af Amer: >60 mL/min (ref >60)
Glucose, Bld: 97 mg/dL (ref 65–99)
POTASSIUM: 3.4 mmol/L — AB (ref 3.5–5.1)
SODIUM: 135 mmol/L (ref 135–145)
Total Bilirubin: 0.3 mg/dL (ref 0.3–1.2)
Total Protein: 4.8 g/dL — ABNORMAL LOW (ref 6.5–8.1)

## 2015-02-06 LAB — CBC
HCT: 21.4 % — ABNORMAL LOW (ref 36.0–46.0)
HEMOGLOBIN: 7.1 g/dL — AB (ref 12.0–15.0)
MCH: 28.3 pg (ref 26.0–34.0)
MCHC: 33.2 g/dL (ref 30.0–36.0)
MCV: 85.3 fL (ref 78.0–100.0)
Platelets: 190 10*3/uL (ref 150–400)
RBC: 2.51 MIL/uL — ABNORMAL LOW (ref 3.87–5.11)
RDW: 15.6 % — AB (ref 11.5–15.5)
WBC: 14.7 10*3/uL — AB (ref 4.0–10.5)

## 2015-02-06 MED ORDER — LACTATED RINGERS IV SOLN: INTRAVENOUS | Status: DC

## 2015-02-06 MED ORDER — FERROUS SULFATE 325 (65 FE) MG PO TABS
325.0000 mg | ORAL_TABLET | Freq: Two times a day (BID) | ORAL | Status: DC
  Administered 2015-02-06 – 2015-02-08 (×5): 325 mg via ORAL
  Filled 2015-02-06 (×5): qty 1

## 2015-02-06 NOTE — Progress Notes (Signed)
CSW acknowledges NICU admission.    Patient screened out for psychosocial assessment since none of the following apply:  Psychosocial stressors documented in mother or baby's chart  Gestation less than 32 weeks  Code at delivery   Critically ill infant  Infant with anomalies  Please contact the Clinical Social Worker if specific needs arise, or by MOB's request.       

## 2015-02-06 NOTE — Anesthesia Postprocedure Evaluation (Signed)
Anesthesia Post Note  Patient: Barbara Scott  Procedure(s) Performed: Procedure(s) (LRB): CESAREAN SECTION WITH BILATERAL TUBAL LIGATION (Bilateral)  Anesthesia type: Spinal  Patient location: Mother/Baby  Post pain: Pain level controlled  Post assessment: Post-op Vital signs reviewed  Last Vitals:  Filed Vitals:   02/06/15 0700  BP: 100/54  Pulse: 98  Temp:   Resp: 18    Post vital signs: Reviewed  Level of consciousness: awake  Complications: No apparent anesthesia complications

## 2015-02-06 NOTE — Progress Notes (Signed)
Subjective: Postpartum Day 1: Cesarean Delivery Patient reports tolerating PO.    Objective: Vital signs in last 24 hours: Temp:  [97.7 F (36.5 C)-98.6 F (37 C)] 98.4 F (36.9 C) (11/02 0100) Pulse Rate:  [61-98] 98 (11/02 0700) Resp:  [11-20] 18 (11/02 0700) BP: (99-151)/(50-84) 100/54 mmHg (11/02 0700) SpO2:  [94 %-100 %] 100 % (11/02 0700)  Physical Exam:  General: alert Lochia: appropriate Uterine Fundus: firm Incision: healing well DVT Evaluation: No evidence of DVT seen on physical exam.   Recent Labs  02/05/15 0325 02/06/15 0514  HGB 9.7* 7.1*  HCT 29.7* 21.4*    Assessment/Plan: Status post Cesarean section. Doing well postoperatively.  Continue current care, MgSO4 X 24 hrs D/c Labetalol rechk CBC and CMET to follow K+.  Ali Mohl M 02/06/2015, 7:52 AM

## 2015-02-06 NOTE — Lactation Note (Signed)
This note was copied from the chart of [place]. Lactation Consultation Note  Patient Name: Boy Teddie Curd PPHKF'E Date: 02/06/2015 Reason for consult: Initial assessment;NICU baby  NICU baby 57 hours old. Mom reports that her first baby was in NICU during first week of life and mom pumped and bottle-fed EBM. Mom states that she liked having bottles of EBM that FOB could also give. Mom states that it is her intention to pump and provide EBM for bottles with this baby as well. Mom asked for small colostrum bottles, and has already taken colostrum to NICU for baby--she believes it was 5 ml. Enc mom to pump 8 times/24 hours for 15 minutes followed by hand expression. Enc mom to use kit with DEBP in the NICU pumping rooms when visiting baby if baby stays beyond mom. Mom states that she has a DEBP at home. Mom given [facility] brochure, aware of OP/BFSG and [place] phone line assistance after D/C.  Maternal Data Has patient been taught Hand Expression?: Yes (per mom) Does the patient have breastfeeding experience prior to this delivery?: Yes  Feeding Feeding Type: Breast Milk with Formula added Nipple Type: Slow - flow Length of feed: 10 min  LATCH Score/Interventions                      Lactation Tools Discussed/Used Pump Review: Setup, frequency, and cleaning;Milk Storage Initiated by:: bedside RN Date initiated:: 02/05/15   Consult Status Consult Status: Follow-up Date: 02/07/15 Follow-up type: In-patient    Inocente Salles 02/06/2015, 11:30 AM

## 2015-02-07 LAB — CBC
HCT: 21.9 % — ABNORMAL LOW (ref 36.0–46.0)
HEMATOCRIT: 18.5 % — AB (ref 36.0–46.0)
HEMOGLOBIN: 6.1 g/dL — AB (ref 12.0–15.0)
HEMOGLOBIN: 7.1 g/dL — AB (ref 12.0–15.0)
MCH: 28.5 pg (ref 26.0–34.0)
MCH: 28.1 pg (ref 26.0–34.0)
MCHC: 33 g/dL (ref 30.0–36.0)
MCHC: 32.4 g/dL (ref 30.0–36.0)
MCV: 86.4 fL (ref 78.0–100.0)
MCV: 86.6 fL (ref 78.0–100.0)
PLATELETS: 236 10*3/uL (ref 150–400)
Platelets: 179 10*3/uL (ref 150–400)
RBC: 2.14 MIL/uL — ABNORMAL LOW (ref 3.87–5.11)
RBC: 2.53 MIL/uL — AB (ref 3.87–5.11)
RDW: 16.2 % — AB (ref 11.5–15.5)
RDW: 16.4 % — AB (ref 11.5–15.5)
WBC: 12 10*3/uL — AB (ref 4.0–10.5)
WBC: 14.5 10*3/uL — AB (ref 4.0–10.5)

## 2015-02-07 MED ORDER — BISACODYL 10 MG RE SUPP
10.0000 mg | Freq: Once | RECTAL | Status: AC
  Administered 2015-02-07: 10 mg via RECTAL
  Filled 2015-02-07: qty 1

## 2015-02-07 NOTE — Lactation Note (Signed)
This note was copied from the chart of [place]. Lactation Consultation Note  Patient Name: Barbara Scott Treat APOLI'D Date: 02/07/2015 Reason for consult: Follow-up assessment;NICU baby NICU baby 49 hours of life. Mom states that pumping is going well and she is starting to collect over 10 ml of EBM. Mom given large bottles for collecting and taking EBM to baby in NICU. Mom reconsidering possibly nursing baby when she gets home. Enc mom to offer STS and nuzzling/latching at breast as baby able while in the hospital, and then the same at home while attempting to nurse--if she decides this is what she wants to do. Mom aware of [place] phone line assistance after D/C.   Maternal Data    Feeding Feeding Type: Formula Nipple Type: Slow - flow Length of feed: 15 min  LATCH Score/Interventions                      Lactation Tools Discussed/Used     Consult Status Consult Status: Follow-up Date: 02/08/15 Follow-up type: In-patient    Inocente Salles 02/07/2015, 2:43 PM

## 2015-02-07 NOTE — Progress Notes (Signed)
CRITICAL VALUE ALERT  Critical value received: Hemoglobin 6.1  Date of notification:  02/07/15  Time of notification:  0626  Critical value read back: Yes  Nurse who received alert:  Carole Binning, RN  MD notified (1st page):  Dr Matthew Saras   Time of first page:  0631  MD notified (2nd page):  Time of second page:  Responding MD:  Dr Matthew Saras   Time MD responded:  [phone]

## 2015-02-07 NOTE — Progress Notes (Signed)
Ambulating without difficulty, no flatus yet-tolerating regular diet  Filed Vitals:   02/07/15 0453  BP: 122/56  Pulse: 81  Temp: 98.4 F (36.9 C)  Resp: 18   UO good  Lungs CTA Cor RRR Abd-soft, good BS, no epigastric tenderness DTR 2+ without clonus  Results for orders placed or performed during the hospital encounter of 02/05/15 (from the past 24 hour(s))  CBC     Status: Abnormal   Collection Time: 02/07/15  4:50 AM  Result Value Ref Range   WBC 12.0 (H) 4.0 - 10.5 K/uL   RBC 2.14 (L) 3.87 - 5.11 MIL/uL   Hemoglobin 6.1 (LL) 12.0 - 15.0 g/dL   HCT 18.5 (L) 36.0 - 46.0 %   MCV 86.4 78.0 - 100.0 fL   MCH 28.5 26.0 - 34.0 pg   MCHC 33.0 30.0 - 36.0 g/dL   RDW 16.2 (H) 11.5 - 15.5 %   Platelets 179 150 - 400 K/uL   A/P: Preeclampsia-resolving         Anemia-currently no evidence of bleeding, lochia minimal                       Check CBC @ 1pm         Dulcolax suppository         Observe         D/W patient

## 2015-02-08 DIAGNOSIS — O1490 Unspecified pre-eclampsia, unspecified trimester: Secondary | ICD-10-CM | POA: Diagnosis present

## 2015-02-08 NOTE — Lactation Note (Signed)
This note was copied from the chart of [place]. Lactation Consultation Note  Patient Name: Barbara Scott [id number] Date: 02/08/2015 Reason for consult: Follow-up assessment    With this mom of a NICU baby, now 66 hours old and 35 5/7  Weeks CGA. Mom is [pumping and has a good milk supply. Mom is aware her supply will increase rapidly now that she has transitioned into maturemilk. She knows to stay hydrated and eat when hungry. Mom has decided to pump and bottle feed for now, and will call for lactation if she changes her mind once home.   Maternal Data    Feeding Feeding Type: Breast Milk Nipple Type: Slow - flow Length of feed: 20 min  LATCH Score/Interventions                      Lactation Tools Discussed/Used     Consult Status Consult Status: PRN Follow-up type: Call as needed    Tonna Corner 02/08/2015, 11:55 AM

## 2015-02-08 NOTE — Progress Notes (Signed)
Patient is doing well.complains of abdominal pain  BP 142/74 mmHg  Pulse 103  Temp(Src) 98.7 F (37.1 C) (Oral)  Resp 18  Ht 5\' 4"  (1.626 m)  Wt 219 lb 11.2 oz (99.655 kg)  BMI 37.69 kg/m2  SpO2 100%  Breastfeeding? Unknown Abdomen is soft and non tender  Bandage clean and dry  Results for orders placed or performed during the hospital encounter of 02/05/15 (from the past 24 hour(s))  CBC     Status: Abnormal   Collection Time: 02/07/15  1:10 PM  Result Value Ref Range   WBC 14.5 (H) 4.0 - 10.5 K/uL   RBC 2.53 (L) 3.87 - 5.11 MIL/uL   Hemoglobin 7.1 (L) 12.0 - 15.0 g/dL   HCT 21.9 (L) 36.0 - 46.0 %   MCV 86.6 78.0 - 100.0 fL   MCH 28.1 26.0 - 34.0 pg   MCHC 32.4 30.0 - 36.0 g/dL   RDW 16.4 (H) 11.5 - 15.5 %   Platelets 236 150 - 400 K/uL   POD #3 Preeclampsia C Section  Routine care  Possible discharge home tomorrow  Baby in NICU

## 2015-02-09 MED ORDER — IBUPROFEN 800 MG PO TABS: 800.0000 mg | ORAL_TABLET | Freq: Three times a day (TID) | ORAL | Status: DC | PRN

## 2015-02-09 MED ORDER — OXYCODONE-ACETAMINOPHEN 5-325 MG PO TABS: 2.0000 | ORAL_TABLET | ORAL | Status: DC | PRN

## 2015-02-09 NOTE — Progress Notes (Signed)
Patient discharged in ambulatory condition with significant other at side. Patient will follow up as scheduled.

## 2015-02-09 NOTE — Discharge Summary (Signed)
Obstetric Discharge Summary Reason for Admission: observation/evaluation Prenatal Procedures: Preeclampsia Intrapartum Procedures: cesarean: low cervical, transverse Postpartum Procedures: none Complications-Operative and Postpartum: none HEMOGLOBIN  Date Value Ref Range Status  02/07/2015 7.1* 12.0 - 15.0 g/dL Final   HCT  Date Value Ref Range Status  02/07/2015 21.9* 36.0 - 46.0 % Final    Physical Exam:  General: alert, cooperative and appears stated age 29: appropriate Uterine Fundus: firm Incision: healing well, no significant drainage DVT Evaluation: No evidence of DVT seen on physical exam.  Discharge Diagnoses: Preelampsia  Discharge Information: Date: 02/09/2015 Activity: pelvic rest Diet: routine Medications: Ibuprofen and Percocet Condition: stable Instructions: refer to practice specific booklet Discharge to: home   Newborn Data: Live born female  Birth Weight: 6 lb 15.1 oz (3150 g) APGAR: 9, 9  Home with nicu.  Marlise Fahr L 02/09/2015, 8:17 AM

## 2015-02-09 NOTE — Progress Notes (Signed)
Patient discharge teaching completed with patient voicing an understanding. Patient provided teachback. Family at side. tjh

## 2015-03-04 ENCOUNTER — Inpatient Hospital Stay (HOSPITAL_COMMUNITY): Admission: RE | Admit: 2015-03-04 | Payer: 59 | Source: Ambulatory Visit | Admitting: Obstetrics and Gynecology

## 2015-03-04 ENCOUNTER — Encounter (HOSPITAL_COMMUNITY): Admission: RE | Payer: Self-pay | Source: Ambulatory Visit

## 2015-03-04 SURGERY — Surgical Case
Anesthesia: Regional

## 2015-10-30 IMAGING — US US MFM OB COMP +14 WKS
1 series · 14 of 28 positions shown · non-contrast
Comparison: none

[Series 1: us mfm ob comp +14 wks · 56 acquisitions, 14 frames shown]
[im 3/56]
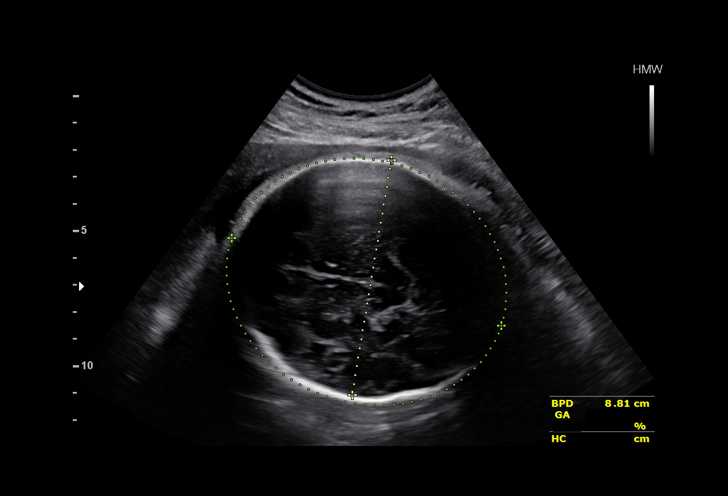
[im 7/56]
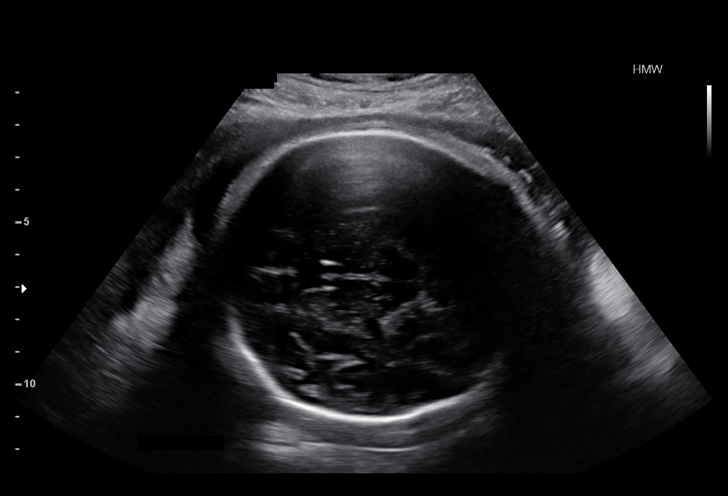
[im 11/56]
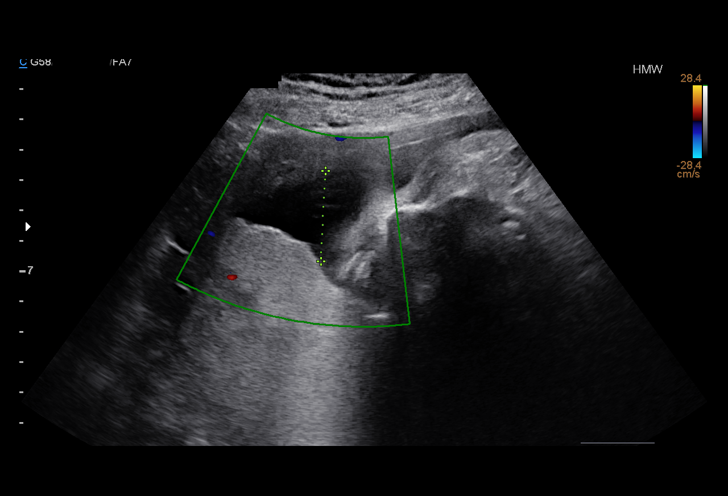
[im 15/56]
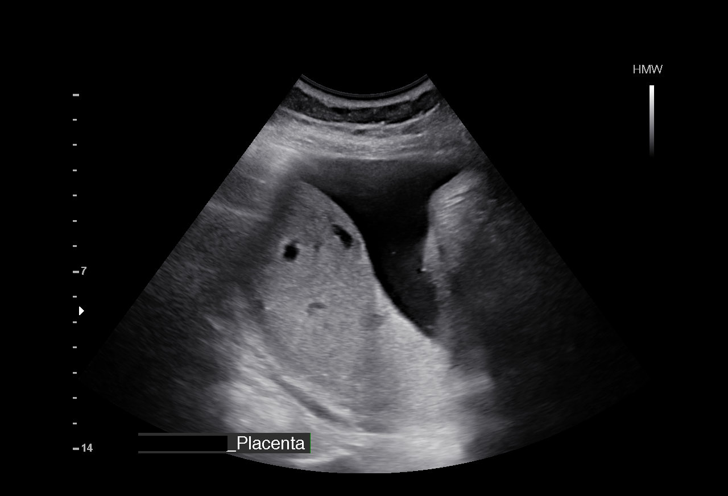
[im 19/56]
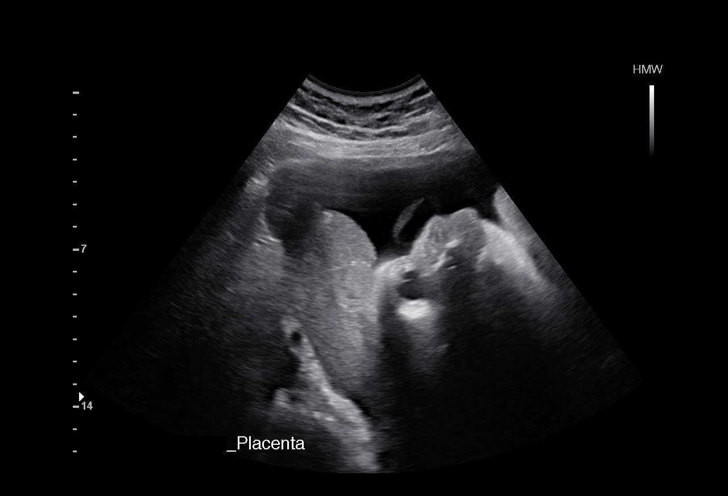
[im 23/56]
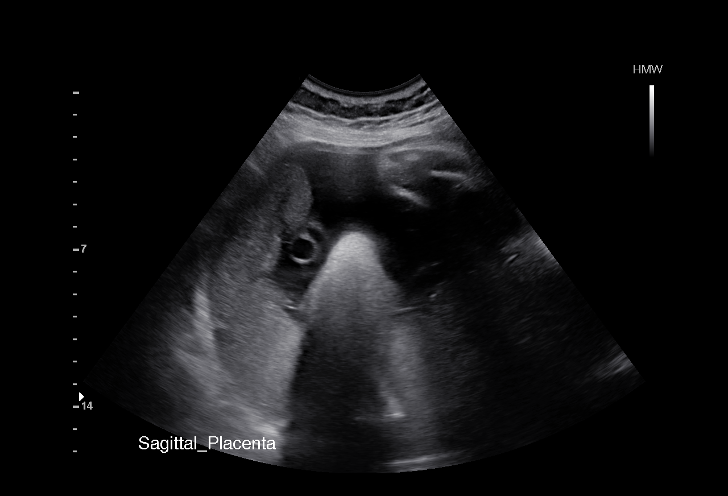
[im 27/56]
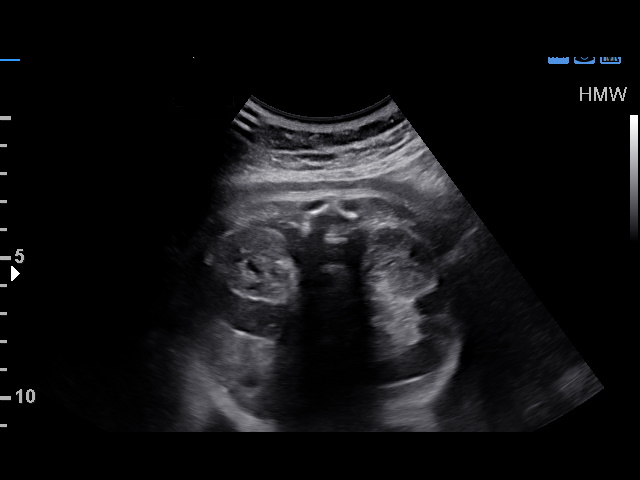
[im 31/56]
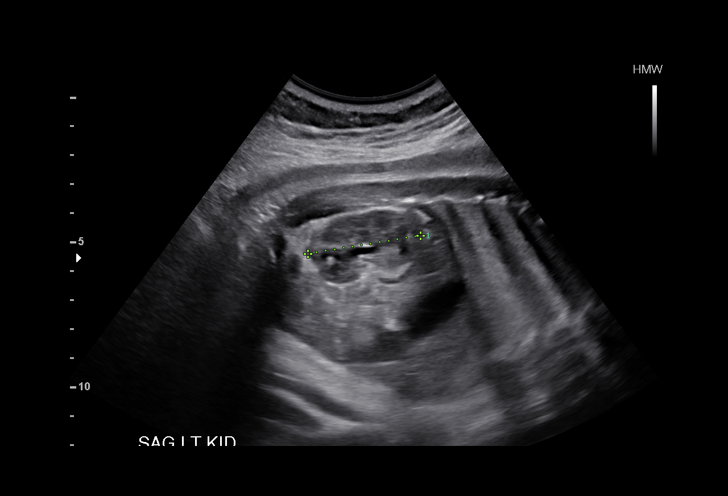
[im 35/56]
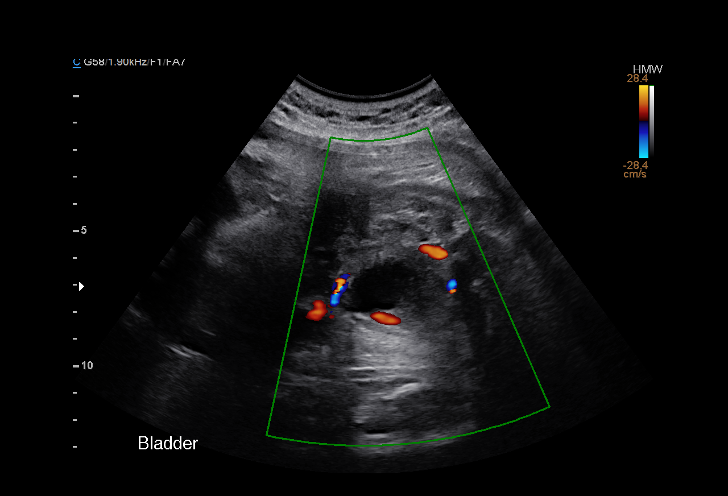
[im 39/56]
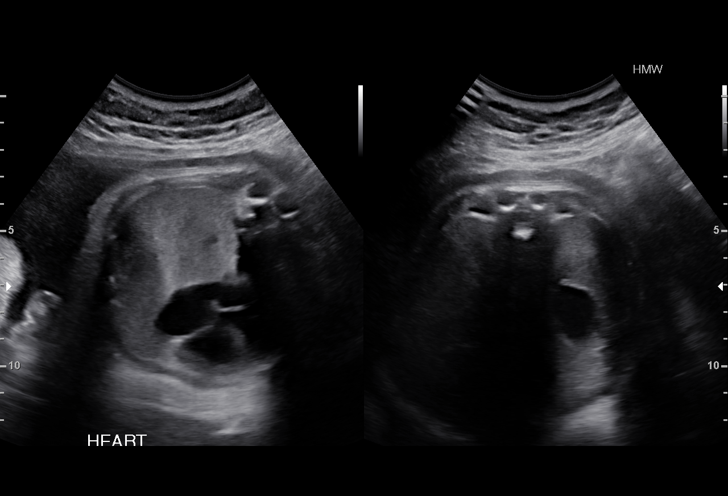
[im 43/56]
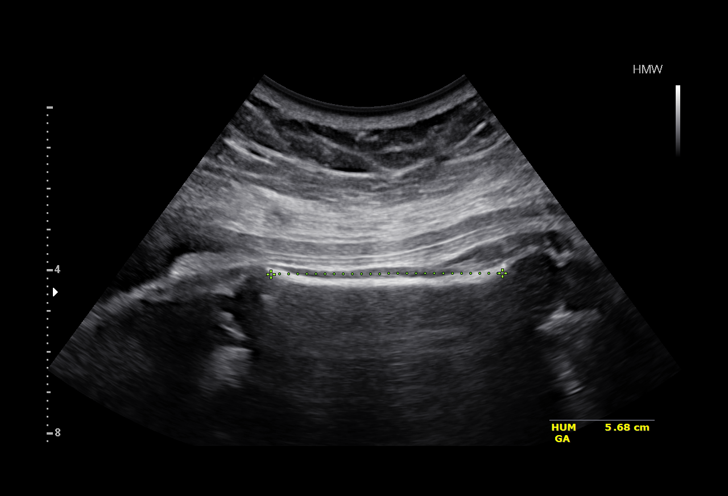
[im 47/56]
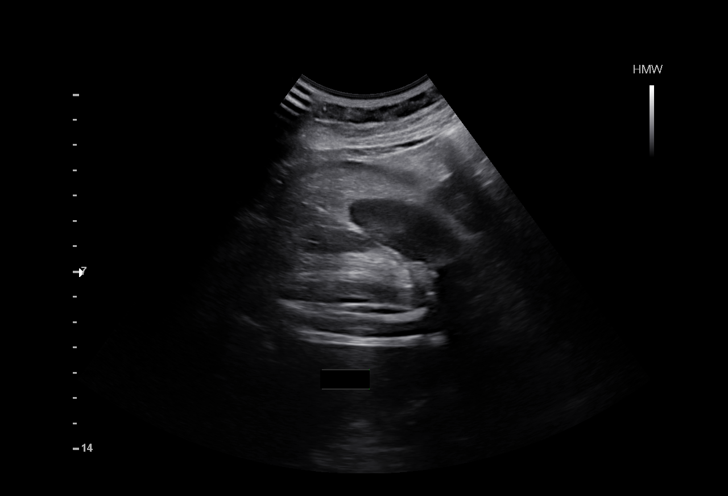
[im 51/56]
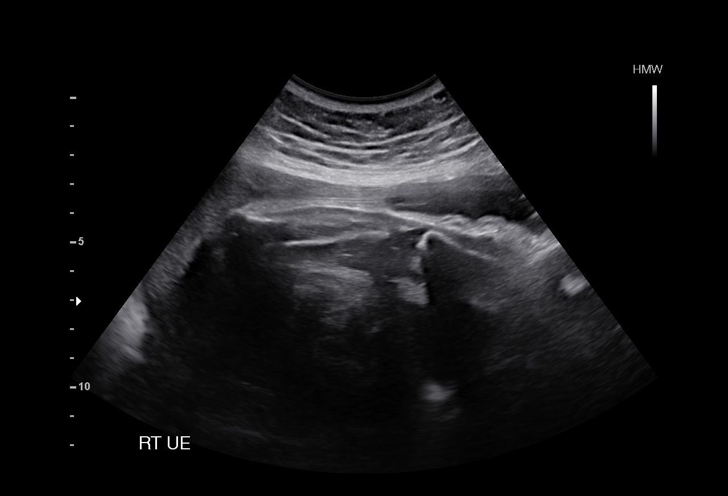
[im 56/56]
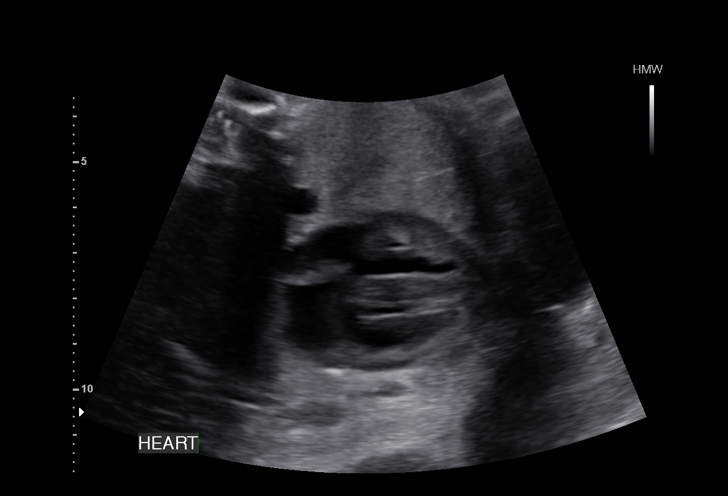

[14 of 28 positions shown; findings below may reference images not displayed]

OBSTETRICS REPORT
(Signed Final 01/29/2015 [DATE])

Date:

Service(s) Provided

Indications

34 weeks gestation of pregnancy
Gestational hypertension without significant
proteinuria, third trimester
Basic anatomic survey                                  Z36
Fetal Evaluation

Num Of             1
Fetuses:
Fetal Heart        150                          bpm
Rate:
Cardiac Activity:  Observed
Presentation:      Cephalic
Placenta:          Posterior, above cervical
os
P. Cord            Visualized, central
Insertion:

Amniotic Fluid
AFI FV:      Subjectively within normal limits
AFI Sum:     19.22    cm      71  %Tile     Larg Pckt:    6.81   cm
RUQ:   2.98    cm    RLQ:   6.81    cm   LUQ:    3.95    cm   LLQ:    5.48   cm
Biometry

BPD:     88.3   m    G. Age:   35w 5d                 CI:         80.8   70 - 86
m
FL/HC:      20.6   19.4 -
21.8
HC:     310.2   m    G. Age:   34w 4d        27  %    HC/AC:      1.00   0.96 -
m
AC:     310.1   m    G. Age:   34w 6d        76  %    FL/BPD      72.3   71 - 87
m                                     :
FL:      63.8   m    G. Age:   33w 0d        15  %    FL/AC:      20.6   20 - 24
m
HUM:     58.7   m    G. Age:   34w 0d        56  %
m
Est.        7933   gm    5 lb 6 oz      66   %
FW:
Gestational Age

U/S Today:     34w 4d                                         EDD:   03/07/15
Best:          34w 1d    Det. By:   Previous Ultrasound       EDD:   03/10/15
Anatomy

Cranium:          Appears normal         Aortic Arch:       Not well visualized
Fetal Cavum:      Appears normal         Ductal Arch:       Not well visualized
Ventricles:       Appears normal         Diaphragm:         Not well visualized
Choroid Plexus:   Not well visualized    Stomach:           Appears normal,
left sided
Cerebellum:       Not well visualized    Abdomen:           Appears normal
Posterior         Not well visualized    Abdominal          Not well visualized
Fossa:                                   Wall:
Nuchal Fold:      Not applicable (>20    Cord Vessels:      Appears normal (3
wks GA)                                   vessel cord)
Face:             Profile appears        Kidneys:           Appear normal
normal
Lips:             Appears normal         Bladder:           Appears normal
Heart:            Appears normal         Spine:             Not well visualized
(4CH, axis, and
situs)
RVOT:             Appears normal         Lower              Visualized
Extremities:
LVOT:             Not well visualized    Upper              Visualized
Extremities:

Other:   Technically difficult due to advanced GA and fetal position.
Cervix Uterus Adnexa

Cervix:       Not visualized (advanced GA >84wks)
Uterus:       No abnormality visualized.
Cul De Sac:   No free fluid seen.

Left Ovary:    Not visualized.
Right Ovary:   Not visualized.

Adnexa:     No abnormality visualized.
Impression

SIUP at 34+1 weeks
Cephalic presentation
Normal detailed fetal anatomy; limited views of PF, LVOT,
arches, CI and spine
Normal amniotic fluid volume
Measurements consistent with prior US; EFW at the 66th
%tile
Recommendations

Follow-up ultrasounds as clinically indicated.

## 2016-01-27 ENCOUNTER — Encounter (HOSPITAL_COMMUNITY): Payer: Self-pay | Admitting: *Deleted

## 2016-01-27 ENCOUNTER — Inpatient Hospital Stay (HOSPITAL_COMMUNITY)
Admission: AD | Admit: 2016-01-27 | Discharge: 2016-01-27 | Disposition: A | Discharge status: Home / Self Care | Payer: Commercial Managed Care - HMO | Source: Ambulatory Visit | Attending: Obstetrics and Gynecology | Admitting: Obstetrics and Gynecology

## 2016-01-27 DIAGNOSIS — R1030 Lower abdominal pain, unspecified: Secondary | ICD-10-CM | POA: Diagnosis not present

## 2016-01-27 DIAGNOSIS — R1013 Epigastric pain: Secondary | ICD-10-CM | POA: Insufficient documentation

## 2016-01-27 DIAGNOSIS — Z9889 Other specified postprocedural states: Secondary | ICD-10-CM | POA: Insufficient documentation

## 2016-01-27 DIAGNOSIS — Z7982 Long term (current) use of aspirin: Secondary | ICD-10-CM | POA: Insufficient documentation

## 2016-01-27 DIAGNOSIS — Z79899 Other long term (current) drug therapy: Secondary | ICD-10-CM | POA: Diagnosis not present

## 2016-01-27 LAB — COMPREHENSIVE METABOLIC PANEL
ALT: 16 U/L (ref 14–54)
ANION GAP: 8 (ref 5–15)
AST: 26 U/L (ref 15–41)
Albumin: 4.7 g/dL (ref 3.5–5.0)
Alkaline Phosphatase: 75 U/L (ref 38–126)
BUN: 16 mg/dL (ref 6–20)
CHLORIDE: 106 mmol/L (ref 101–111)
CO2: 25 mmol/L (ref 22–32)
Calcium: 9.5 mg/dL (ref 8.9–10.3)
Creatinine, Ser: 0.84 mg/dL (ref 0.44–1.00)
Glucose, Bld: 100 mg/dL — ABNORMAL HIGH (ref 65–99)
POTASSIUM: 4 mmol/L (ref 3.5–5.1)
Sodium: 139 mmol/L (ref 135–145)
Total Bilirubin: 0.7 mg/dL (ref 0.3–1.2)
Total Protein: 7.6 g/dL (ref 6.5–8.1)

## 2016-01-27 LAB — CBC WITH DIFFERENTIAL/PLATELET
BASOS ABS: 0 10*3/uL (ref 0.0–0.1)
Basophils Relative: 0 %
EOS PCT: 1 %
Eosinophils Absolute: 0.1 10*3/uL (ref 0.0–0.7)
HCT: 40.3 % (ref 36.0–46.0)
Hemoglobin: 13.5 g/dL (ref 12.0–15.0)
LYMPHS PCT: 38 %
Lymphs Abs: 3.6 10*3/uL (ref 0.7–4.0)
MCH: 27.8 pg (ref 26.0–34.0)
MCHC: 33.5 g/dL (ref 30.0–36.0)
MCV: 82.9 fL (ref 78.0–100.0)
MONO ABS: 0.4 10*3/uL (ref 0.1–1.0)
MONOS PCT: 4 %
Neutro Abs: 5.4 10*3/uL (ref 1.7–7.7)
Neutrophils Relative %: 57 %
PLATELETS: 308 10*3/uL (ref 150–400)
RBC: 4.86 MIL/uL (ref 3.87–5.11)
RDW: 14 % (ref 11.5–15.5)
WBC: 9.5 10*3/uL (ref 4.0–10.5)

## 2016-01-27 LAB — URINALYSIS, ROUTINE W REFLEX MICROSCOPIC
Bilirubin Urine: NEGATIVE
GLUCOSE, UA: NEGATIVE mg/dL
Ketones, ur: NEGATIVE mg/dL
Nitrite: NEGATIVE
PROTEIN: NEGATIVE mg/dL
Specific Gravity, Urine: <1.005 — ABNORMAL LOW (ref 1.005–1.030)
pH: 7 (ref 5.0–8.0)

## 2016-01-27 LAB — URINE MICROSCOPIC-ADD ON

## 2016-01-27 MED ORDER — GI COCKTAIL ~~LOC~~
30.0000 mL | Freq: Once | ORAL | Status: AC
  Administered 2016-01-27: 30 mL via ORAL
  Filled 2016-01-27: qty 30

## 2016-01-27 MED ORDER — SIMETHICONE 80 MG PO CHEW: 80.0000 mg | CHEWABLE_TABLET | Freq: Four times a day (QID) | ORAL | 0 refills | Status: DC | PRN

## 2016-01-27 MED ORDER — SIMETHICONE 80 MG PO CHEW
80.0000 mg | CHEWABLE_TABLET | Freq: Once | ORAL | Status: AC
  Administered 2016-01-27: 80 mg via ORAL
  Filled 2016-01-27: qty 1

## 2016-01-27 NOTE — Discharge Instructions (Signed)

## 2016-01-27 NOTE — MAU Provider Note (Signed)
Chief Complaint:  Abdominal Pain   First Provider Initiated Contact with Patient 01/27/16 1901     HPI: Barbara Scott is a 30 y.o. [id number] who presents to maternity admissions reporting sudden onset of epigastric pain.  States had a little nausea at the same time.  Pain started in the middle of her back and then wrapped around to the epigastrum.  States had some lower abdominal pain yesterday which made her think she had an ovarian cyst, but it went away and does not hurt today. She reports vaginal bleeding (menses), but no vaginal itching/burning, urinary symptoms, h/a, dizziness, vomiting, or fever/chills.   Has had cholecystectomy in the past  Abdominal Pain  This is a new problem. The current episode started today. The onset quality is sudden. The problem occurs constantly. The problem has been unchanged. The pain is located in the epigastric region. The pain is severe. The quality of the pain is burning and sharp. The abdominal pain radiates to the back. Associated symptoms include nausea. Pertinent negatives include no constipation, diarrhea, dysuria, fever, frequency, myalgias or vomiting. Nothing aggravates the pain. The pain is relieved by nothing. She has tried nothing for the symptoms. Her past medical history is significant for abdominal surgery (gallbladder removed).   RN Note: States around 1800 she felt some pain in the center of her back and all of a sudden she started having sharp upper abdominal pain, constant. Points to mid upper abdomen. States she has had her gallbladder removed. Feels nauseated, she thinks probably due to pain. No vomiting Not pregnant  Past Medical History: Past Medical History:  Diagnosis Date  . Headache(784.0)    migraines  . Pregnancy induced hypertension   . Skin lesions 01/2014   chest, right axilla, abd., back    Past obstetric history: OB History  Gravida Para Term Preterm AB Living  '3 2 1 1 1 2  '$ SAB TAB Ectopic Multiple Live Births  1      0 2    # Outcome Date GA Lbr Len/2nd Weight Sex Delivery Anes PTL Lv  3 Preterm 02/05/15 [redacted]w[redacted]d 6 lb 15.1 oz (3.15 kg) M CS-LTranv Spinal  LIV  2 Term 02/14/13 332w2d 01:57 9 lb 6.3 oz (4.26 kg) M Vag-Spont EPI  LIV     Birth Comments: LGA, poor perfusion, grunting loudly, subcostal retractions  1 SAB               Past Surgical History: Past Surgical History:  Procedure Laterality Date  . CESAREAN SECTION WITH BILATERAL TUBAL LIGATION Bilateral 02/05/2015   Procedure: CESAREAN SECTION WITH BILATERAL TUBAL LIGATION;  Surgeon: RiMolli PoseyMD;  Location: [facility];  Service: Obstetrics;  Laterality: Bilateral;  . CHOLECYSTECTOMY    . CHOLESTEATOMA EXCISION    . DILATION AND EVACUATION N/A 01/01/2014   Procedure: DILATATION AND EVACUATION;  Surgeon: RiMargarette AsalMD;  Location: [facility];  Service: Gynecology;  Laterality: N/A;  . EYE SURGERY    . KNEE ARTHROSCOPY    . LESION REMOVAL N/A 02/02/2014   Procedure: MINOR EXICISION OF LESION X3  RIGHT AXILLA, CHEST AND ABDOMEN;  Surgeon: DaAlphonsa OverallMD;  [place] [facility] Service: General;  Laterality: N/A;  . MELANOMA EXCISION    . NEVUS EXCISION  08/07/2004   multiple  . TONSILLECTOMY      Family History: Family History  Problem Relation Age of Onset  . Hypertension Mother   . Thyroid disease Mother   .  Cancer Mother     melanoma  . Cancer Father   . Hypertension Maternal Uncle   . Heart disease Maternal Uncle   . Cancer Maternal Grandfather     lung prostate    Social History: Social History  Substance Use Topics  . Smoking status: Never Smoker  . Smokeless tobacco: Never Used  . Alcohol use No    Allergies:  Allergies  Allergen Reactions  . Bee Venom Swelling  . Shrimp [Shellfish Allergy] Swelling    FACIAL SWELLING  . Betadine [Povidone Iodine] Swelling  . Vicodin [Hydrocodone-Acetaminophen] Itching    Pt states that she takes percocet without reaction    Meds:  Prescriptions Prior  to Admission  Medication Sig Dispense Refill Last Dose  . aspirin-acetaminophen-caffeine (EXCEDRIN MIGRAINE) 250-250-65 MG tablet Take by mouth every 6 (six) hours as needed for headache.   01/27/2016 at Unknown time  . EPINEPHrine (EPIPEN JR) 0.15 MG/0.3ML injection Inject 0.15 mg into the muscle as needed for anaphylaxis.    Emergency  . phentermine 37.5 MG capsule Take 1 capsule by mouth daily.   01/27/2016 at Unknown time    I have reviewed patient's Past Medical Hx, Surgical Hx, Family Hx, Social Hx, medications and allergies.  ROS:  Review of Systems  Constitutional: Negative for fever.  Gastrointestinal: Positive for abdominal pain and nausea. Negative for constipation, diarrhea and vomiting.  Genitourinary: Negative for dysuria and frequency.  Musculoskeletal: Negative for myalgias.   Other systems negative     Physical Exam  Patient Vitals for the past 24 hrs:  BP Temp Temp src Pulse Resp SpO2 Height Weight  01/27/16 1844 138/75 97.8 F (36.6 C) Oral 89 22 100 % '5\' 5"'$  (1.651 m) 176 lb (79.8 kg)   Constitutional: Well-developed, well-nourished female in no acute distress.  Cardiovascular: normal rate and rhythm, no ectopy audible, S1 & S2 heard, no murmur Respiratory: normal effort, no distress. Lungs CTAB with no wheezes or crackles GI: Abd soft, tender over epigastrum.  Nondistended.  No rebound, No guarding.  Bowel Sounds audible  MS: Extremities nontender, no edema, normal ROM Neurologic: Alert and oriented x 4.   Grossly nonfocal. GU: Neg CVAT. Skin:  Warm and Dry Psych:  Affect appropriate.  PELVIC EXAM: deferred due to location of pain   Labs: Results for orders placed or performed during the hospital encounter of 01/27/16 (from the past 72 hour(s))  Urinalysis, Routine w reflex microscopic (not at [facility])     Status: Abnormal   Collection Time: 01/27/16  7:20 PM  Result Value Ref Range   Color, Urine YELLOW YELLOW   APPearance CLEAR CLEAR   Specific Gravity,  Urine <1.005 (L) 1.005 - 1.030   pH 7.0 5.0 - 8.0   Glucose, UA NEGATIVE NEGATIVE mg/dL   Hgb urine dipstick SMALL (A) NEGATIVE   Bilirubin Urine NEGATIVE NEGATIVE   Ketones, ur NEGATIVE NEGATIVE mg/dL   Protein, ur NEGATIVE NEGATIVE mg/dL   Nitrite NEGATIVE NEGATIVE   Leukocytes, UA TRACE (A) NEGATIVE  Urine microscopic-add on     Status: Abnormal   Collection Time: 01/27/16  7:20 PM  Result Value Ref Range   Squamous Epithelial / LPF 0-5 (A) NONE SEEN   WBC, UA 0-5 0 - 5 WBC/hpf   RBC / HPF 0-5 0 - 5 RBC/hpf   Bacteria, UA RARE (A) NONE SEEN  CBC with Differential/Platelet     Status: None   Collection Time: 01/27/16  7:21 PM  Result Value Ref Range  WBC 9.5 4.0 - 10.5 K/uL   RBC 4.86 3.87 - 5.11 MIL/uL   Hemoglobin 13.5 12.0 - 15.0 g/dL   HCT 40.3 36.0 - 46.0 %   MCV 82.9 78.0 - 100.0 fL   MCH 27.8 26.0 - 34.0 pg   MCHC 33.5 30.0 - 36.0 g/dL   RDW 14.0 11.5 - 15.5 %   Platelets 308 150 - 400 K/uL   Neutrophils Relative % 57 %   Neutro Abs 5.4 1.7 - 7.7 K/uL   Lymphocytes Relative 38 %   Lymphs Abs 3.6 0.7 - 4.0 K/uL   Monocytes Relative 4 %   Monocytes Absolute 0.4 0.1 - 1.0 K/uL   Eosinophils Relative 1 %   Eosinophils Absolute 0.1 0.0 - 0.7 K/uL   Basophils Relative 0 %   Basophils Absolute 0.0 0.0 - 0.1 K/uL  Comprehensive metabolic panel     Status: Abnormal   Collection Time: 01/27/16  7:21 PM  Result Value Ref Range   Sodium 139 135 - 145 mmol/L   Potassium 4.0 3.5 - 5.1 mmol/L   Chloride 106 101 - 111 mmol/L   CO2 25 22 - 32 mmol/L   Glucose, Bld 100 (H) 65 - 99 mg/dL   BUN 16 6 - 20 mg/dL   Creatinine, Ser 0.84 0.44 - 1.00 mg/dL   Calcium 9.5 8.9 - 10.3 mg/dL   Total Protein 7.6 6.5 - 8.1 g/dL   Albumin 4.7 3.5 - 5.0 g/dL   AST 26 15 - 41 U/L   ALT 16 14 - 54 U/L   Alkaline Phosphatase 75 38 - 126 U/L   Total Bilirubin 0.7 0.3 - 1.2 mg/dL   GFR calc non Af Amer >60 >60 mL/min   GFR calc Af Amer >60 >60 mL/min    Comment: (NOTE) The eGFR has been  calculated using the CKD EPI equation. This calculation has not been validated in all clinical situations. eGFR's persistently <60 mL/min signify possible Chronic Kidney Disease.    Anion gap 8 5 - 15    Imaging:  No results found.  MAU Course/MDM: I have ordered labs as follows:  UA, CBC, CMET Imaging ordered: none Results reviewed.   Consult Dr Matthew Saras with presentation, exam findings and lab results.  .   Treatments in MAU included Initially gave GI cocktail with little relief.  Then decided to draw labs and check urine to rule out stones or other acute abdominal process.  These were all negative.   We then gave her simethicone which provided complete relief of pain soon thereafter. Dr Matthew Saras updated and pt requested discharge home.   Pt stable at time of discharge.  Assessment: Acute abdominal pain with resolution after simethicone No evidence of other acute abdominal processes  Plan: Discharge home Recommend probiotics, fluids, advance diet as tolerated Rx sent for Mylicon  for gas pains Follow up in office this week   Encouraged to return here or to other Urgent Care/ED if she develops worsening of symptoms, increase in pain, fever, or other concerning symptoms.   Hansel Feinstein CNM, MSN Certified Nurse-Midwife 01/27/2016 7:11 PM

## 2016-01-27 NOTE — MAU Note (Signed)
States around 1800 she felt some pain in the center of her back and all of a sudden she started having sharp upper abdominal pain, constant. Points to mid upper abdomen. States she has had her gallbladder removed. Feels nauseated, she thinks probably due to pain. No vomiting.

## 2016-04-10 DIAGNOSIS — N924 Excessive bleeding in the premenopausal period: Secondary | ICD-10-CM | POA: Diagnosis not present

## 2016-04-10 DIAGNOSIS — N946 Dysmenorrhea, unspecified: Secondary | ICD-10-CM | POA: Diagnosis not present

## 2016-04-19 ENCOUNTER — Other Ambulatory Visit: Payer: Self-pay | Admitting: Obstetrics & Gynecology

## 2016-04-19 MED ORDER — GENTAMICIN SULFATE 0.3 % OP SOLN: 2.0000 [drp] | Freq: Four times a day (QID) | OPHTHALMIC | 1 refills | Status: DC

## 2016-04-19 NOTE — Progress Notes (Signed)
Barbara Scott has 2 day history of right eye conjunctival  Injection no drainage  She has had a URI for 10 days Has happened 1 time before  Meds ordered this encounter  Medications  . gentamicin (GARAMYCIN) 0.3 % ophthalmic solution    Sig: Place 2 drops into the right eye 4 (four) times daily.    Dispense:  5 mL    Refill:  1    If does not improve in 48 hours see her opthamologist  Florian Buff, MD 04/19/2016 8:20 AM

## 2016-06-12 DIAGNOSIS — E669 Obesity, unspecified: Secondary | ICD-10-CM | POA: Diagnosis not present

## 2016-10-19 DIAGNOSIS — F4323 Adjustment disorder with mixed anxiety and depressed mood: Secondary | ICD-10-CM | POA: Diagnosis not present

## 2016-10-19 DIAGNOSIS — R5383 Other fatigue: Secondary | ICD-10-CM | POA: Diagnosis not present

## 2016-10-19 DIAGNOSIS — Z0000 Encounter for general adult medical examination without abnormal findings: Secondary | ICD-10-CM | POA: Diagnosis not present

## 2016-10-19 DIAGNOSIS — E669 Obesity, unspecified: Secondary | ICD-10-CM | POA: Diagnosis not present

## 2016-11-03 DIAGNOSIS — N946 Dysmenorrhea, unspecified: Secondary | ICD-10-CM | POA: Diagnosis not present

## 2016-11-03 DIAGNOSIS — R102 Pelvic and perineal pain: Secondary | ICD-10-CM | POA: Diagnosis not present

## 2016-11-20 DIAGNOSIS — F4323 Adjustment disorder with mixed anxiety and depressed mood: Secondary | ICD-10-CM | POA: Diagnosis not present

## 2016-11-20 DIAGNOSIS — E669 Obesity, unspecified: Secondary | ICD-10-CM | POA: Diagnosis not present

## 2017-01-05 NOTE — H&P (Signed)
Barbara Scott  DICTATION # [id number] CSN# [id number]   Margarette Asal, MD 01/05/2017 11:42 AM

## 2017-01-06 NOTE — H&P (Signed)
NAMEMALVINA, [name]:  [id number]  MEDICAL RECORD NO.:  [id number]  LOCATION:                                 FACILITY:  PHYSICIAN:  Ralene Bathe. Matthew Saras, M.D.    DATE OF BIRTH:  DATE OF ADMISSION: DATE OF DISCHARGE:                             HISTORY & PHYSICAL   CHIEF COMPLAINT:  Dysmenorrhea.  HISTORY OF PRESENT ILLNESS:  A 31 year old, G3, P2, prior tubal.  She has had 1 vaginal delivery in 2014 and 1 cesarean section in 2016.  Due to continued problems with severe dysmenorrhea, unresponsive to conservative measures, presents at this time for LAVH, bilateral salpingectomy.  This procedure including specific risks regarding bleeding, infection, transfusion, adjacent organ injury, wound infection, phlebitis, the possible need to complete the surgery by open technique, all discussed with her which she understands and accepts.  ALLERGIES:  SHRIMP, VICODIN, BEE STINGS.  CURRENT MEDICATIONS:  Motrin p.r.n.  PAST SURGICAL HISTORY:  Vaginal delivery in 2014, C-section in 2016 with tubal.  She has had a knee arthroscopy, tonsillectomy, excision of skin lesion, 1 D and E, and LASIK.  FAMILY HISTORY:  Significant for hypertension, and skin, prostate, and lung cancer.  SOCIAL HISTORY:  She is married.  Denies alcohol, tobacco, or drug use.  PHYSICAL EXAMINATION:  VITAL SIGNS:  Temp 98.2, blood pressure 116/80. HEENT:  Unremarkable. NECK:  Supple without masses. LUNGS:  Clear. CARDIOVASCULAR:  Regular rate and rhythm without murmurs, rubs, or gallops noted. BREASTS:  Without masses. ABDOMEN:  Soft, flat, nontender.  Vulva, vagina, cervix normal.  Uterus mid position, mobile.  Adnexa negative. EXTREMITIES:  Unremarkable. NEUROLOGIC:  Unremarkable.  IMPRESSION:  Severe dysmenorrhea.  PLAN:  LAVH, bilateral salpingectomy.  Procedure and risks reviewed as above.     Barbara Scott M. Matthew Saras, M.D.   ______________________________ Ralene Bathe. Matthew Saras,  M.D.    RM[facility]/MEDQ  D:  01/05/2017  T:  01/06/2017  Job:  [id number]

## 2017-01-13 NOTE — Patient Instructions (Addendum)
  Your procedure is scheduled on:  Monday, Oct. 22  Enter through the Main Entrance of [facility] at: 6 am  Pick up the phone at the desk and dial [phone].  Call this number if you have problems the morning of surgery: [phone].  Remember: Do NOT eat food or drink clear liquids (including water) after midnight Sunday.  Take these medicines the morning of surgery with a SIP OF WATER:  None  Do NOT wear jewelry (body piercing), metal hair clips/bobby pins, make-up, or nail polish. Do NOT wear lotions, powders, or perfumes.  You may wear deoderant. Do NOT shave for 48 hours prior to surgery. Do NOT bring valuables to the hospital. Contacts may not be worn into surgery.  Leave suitcase in car.  After surgery it may be brought to your room.  For patients admitted to the hospital, checkout time is 11:00 AM the day of discharge. Have a responsible adult drive you home and stay with you for 24 hours after your procedure.  Home with husband Barbara Scott cell [phone]

## 2017-01-20 DIAGNOSIS — R102 Pelvic and perineal pain: Secondary | ICD-10-CM | POA: Diagnosis not present

## 2017-01-20 DIAGNOSIS — N946 Dysmenorrhea, unspecified: Secondary | ICD-10-CM | POA: Diagnosis not present

## 2017-01-21 ENCOUNTER — Encounter (HOSPITAL_COMMUNITY): Payer: Self-pay | Admitting: *Deleted

## 2017-01-21 ENCOUNTER — Encounter (HOSPITAL_COMMUNITY)
Admission: RE | Admit: 2017-01-21 | Discharge: 2017-01-21 | Disposition: A | Discharge status: Home / Self Care | Payer: 59 | Source: Ambulatory Visit | Attending: Obstetrics and Gynecology | Admitting: Obstetrics and Gynecology

## 2017-01-21 DIAGNOSIS — Z01812 Encounter for preprocedural laboratory examination: Secondary | ICD-10-CM | POA: Diagnosis not present

## 2017-01-21 DIAGNOSIS — D649 Anemia, unspecified: Secondary | ICD-10-CM | POA: Insufficient documentation

## 2017-01-21 HISTORY — DX: Anemia, unspecified: D64.9

## 2017-01-21 LAB — CBC
HEMATOCRIT: 39.6 % (ref 36.0–46.0)
HEMOGLOBIN: 12.7 g/dL (ref 12.0–15.0)
MCH: 27.6 pg (ref 26.0–34.0)
MCHC: 32.1 g/dL (ref 30.0–36.0)
MCV: 86.1 fL (ref 78.0–100.0)
Platelets: 305 10*3/uL (ref 150–400)
RBC: 4.6 MIL/uL (ref 3.87–5.11)
RDW: 14.7 % (ref 11.5–15.5)
WBC: 10.9 10*3/uL — AB (ref 4.0–10.5)

## 2017-01-21 LAB — TYPE AND SCREEN
ABO/RH(D): A POS
ANTIBODY SCREEN: NEGATIVE

## 2017-01-25 ENCOUNTER — Ambulatory Visit (HOSPITAL_COMMUNITY): Payer: 59 | Admitting: Anesthesiology

## 2017-01-25 ENCOUNTER — Observation Stay (HOSPITAL_COMMUNITY)
Admission: AD | Admit: 2017-01-25 | Discharge: 2017-01-26 | Disposition: A | Discharge status: Home / Self Care | Payer: 59 | Source: Ambulatory Visit | Attending: Obstetrics and Gynecology | Admitting: Obstetrics and Gynecology

## 2017-01-25 ENCOUNTER — Encounter (HOSPITAL_COMMUNITY): Payer: Self-pay | Admitting: Emergency Medicine

## 2017-01-25 ENCOUNTER — Encounter (HOSPITAL_COMMUNITY)
Admission: AD | Disposition: A | Discharge status: Home / Self Care | Payer: Self-pay | Source: Ambulatory Visit | Attending: Obstetrics and Gynecology

## 2017-01-25 DIAGNOSIS — N858 Other specified noninflammatory disorders of uterus: Secondary | ICD-10-CM | POA: Diagnosis not present

## 2017-01-25 DIAGNOSIS — R102 Pelvic and perineal pain: Principal | ICD-10-CM | POA: Insufficient documentation

## 2017-01-25 DIAGNOSIS — N946 Dysmenorrhea, unspecified: Secondary | ICD-10-CM | POA: Insufficient documentation

## 2017-01-25 DIAGNOSIS — I10 Essential (primary) hypertension: Secondary | ICD-10-CM | POA: Insufficient documentation

## 2017-01-25 DIAGNOSIS — N809 Endometriosis, unspecified: Secondary | ICD-10-CM | POA: Diagnosis not present

## 2017-01-25 DIAGNOSIS — E669 Obesity, unspecified: Secondary | ICD-10-CM | POA: Diagnosis not present

## 2017-01-25 DIAGNOSIS — N888 Other specified noninflammatory disorders of cervix uteri: Secondary | ICD-10-CM | POA: Insufficient documentation

## 2017-01-25 DIAGNOSIS — Z6831 Body mass index (BMI) 31.0-31.9, adult: Secondary | ICD-10-CM | POA: Insufficient documentation

## 2017-01-25 DIAGNOSIS — N8302 Follicular cyst of left ovary: Secondary | ICD-10-CM | POA: Insufficient documentation

## 2017-01-25 DIAGNOSIS — N800 Endometriosis of uterus: Secondary | ICD-10-CM | POA: Diagnosis not present

## 2017-01-25 DIAGNOSIS — G8918 Other acute postprocedural pain: Secondary | ICD-10-CM | POA: Diagnosis not present

## 2017-01-25 HISTORY — PX: LAPAROSCOPIC ASSISTED VAGINAL HYSTERECTOMY: SHX5398

## 2017-01-25 LAB — PREGNANCY, URINE: PREG TEST UR: NEGATIVE

## 2017-01-25 SURGERY — HYSTERECTOMY, VAGINAL, LAPAROSCOPY-ASSISTED
Anesthesia: General | Site: Abdomen

## 2017-01-25 MED ORDER — PROPOFOL 10 MG/ML IV BOLUS
INTRAVENOUS | Status: DC | PRN
  Administered 2017-01-25: 160 mg via INTRAVENOUS

## 2017-01-25 MED ORDER — ONDANSETRON HCL 4 MG/2ML IJ SOLN: 4.0000 mg | Freq: Four times a day (QID) | INTRAMUSCULAR | Status: DC | PRN

## 2017-01-25 MED ORDER — LACTATED RINGERS IR SOLN
Status: DC | PRN
  Administered 2017-01-25: 3000 mL

## 2017-01-25 MED ORDER — ACETAMINOPHEN 500 MG PO TABS
ORAL_TABLET | ORAL | Status: AC
  Filled 2017-01-25: qty 2

## 2017-01-25 MED ORDER — MIDAZOLAM HCL 2 MG/2ML IJ SOLN
INTRAMUSCULAR | Status: AC
  Filled 2017-01-25: qty 2

## 2017-01-25 MED ORDER — BUPIVACAINE-EPINEPHRINE (PF) 0.25% -1:200000 IJ SOLN
INTRAMUSCULAR | Status: DC | PRN
  Administered 2017-01-25: 60 mL via PERINEURAL

## 2017-01-25 MED ORDER — SUGAMMADEX SODIUM 200 MG/2ML IV SOLN
INTRAVENOUS | Status: AC
  Filled 2017-01-25: qty 2

## 2017-01-25 MED ORDER — BUPIVACAINE HCL (PF) 0.25 % IJ SOLN
INTRAMUSCULAR | Status: AC
  Filled 2017-01-25: qty 30

## 2017-01-25 MED ORDER — LIDOCAINE HCL (CARDIAC) 20 MG/ML IV SOLN
INTRAVENOUS | Status: DC | PRN
  Administered 2017-01-25: 60 mg via INTRAVENOUS

## 2017-01-25 MED ORDER — DIPHENHYDRAMINE HCL 12.5 MG/5ML PO ELIX: 12.5000 mg | ORAL_SOLUTION | Freq: Four times a day (QID) | ORAL | Status: DC | PRN

## 2017-01-25 MED ORDER — LIDOCAINE HCL (CARDIAC) 20 MG/ML IV SOLN
INTRAVENOUS | Status: AC
  Filled 2017-01-25: qty 5

## 2017-01-25 MED ORDER — BUPIVACAINE HCL (PF) 0.25 % IJ SOLN
INTRAMUSCULAR | Status: DC | PRN
  Administered 2017-01-25: 10 mL

## 2017-01-25 MED ORDER — IBUPROFEN 800 MG PO TABS: 800.0000 mg | ORAL_TABLET | Freq: Three times a day (TID) | ORAL | Status: DC | PRN

## 2017-01-25 MED ORDER — ONDANSETRON HCL 4 MG/2ML IJ SOLN
INTRAMUSCULAR | Status: DC | PRN
  Administered 2017-01-25: 4 mg via INTRAVENOUS

## 2017-01-25 MED ORDER — NALOXONE HCL 0.4 MG/ML IJ SOLN: 0.4000 mg | INTRAMUSCULAR | Status: DC | PRN

## 2017-01-25 MED ORDER — MIDAZOLAM HCL 2 MG/2ML IJ SOLN
INTRAMUSCULAR | Status: DC | PRN
  Administered 2017-01-25: 2 mg via INTRAVENOUS

## 2017-01-25 MED ORDER — KETOROLAC TROMETHAMINE 30 MG/ML IJ SOLN
INTRAMUSCULAR | Status: DC | PRN
  Administered 2017-01-25: 30 mg via INTRAVENOUS

## 2017-01-25 MED ORDER — KETOROLAC TROMETHAMINE 30 MG/ML IJ SOLN: 30.0000 mg | Freq: Four times a day (QID) | INTRAMUSCULAR | Status: DC

## 2017-01-25 MED ORDER — ONDANSETRON HCL 4 MG/2ML IJ SOLN
INTRAMUSCULAR | Status: AC
Start: 2017-01-25 — End: 2017-01-25
  Filled 2017-01-25: qty 2

## 2017-01-25 MED ORDER — KETOROLAC TROMETHAMINE 30 MG/ML IJ SOLN
INTRAMUSCULAR | Status: AC
Start: 2017-01-25 — End: 2017-01-25
  Filled 2017-01-25: qty 1

## 2017-01-25 MED ORDER — FENTANYL CITRATE (PF) 100 MCG/2ML IJ SOLN
INTRAMUSCULAR | Status: AC
  Filled 2017-01-25: qty 2

## 2017-01-25 MED ORDER — LACTATED RINGERS IV SOLN
INTRAVENOUS | Status: DC
  Administered 2017-01-25 (×2): via INTRAVENOUS

## 2017-01-25 MED ORDER — PROPOFOL 10 MG/ML IV BOLUS
INTRAVENOUS | Status: AC
  Filled 2017-01-25: qty 20

## 2017-01-25 MED ORDER — DIPHENHYDRAMINE HCL 50 MG/ML IJ SOLN: 12.5000 mg | Freq: Four times a day (QID) | INTRAMUSCULAR | Status: DC | PRN

## 2017-01-25 MED ORDER — ACETAMINOPHEN 160 MG/5ML PO SOLN
ORAL | Status: AC
  Filled 2017-01-25: qty 40.6

## 2017-01-25 MED ORDER — DEXAMETHASONE SODIUM PHOSPHATE 10 MG/ML IJ SOLN
INTRAMUSCULAR | Status: DC | PRN
  Administered 2017-01-25: 4 mg via INTRAVENOUS

## 2017-01-25 MED ORDER — MENTHOL 3 MG MT LOZG: 1.0000 | LOZENGE | OROMUCOSAL | Status: DC | PRN

## 2017-01-25 MED ORDER — HYDROMORPHONE HCL 1 MG/ML IJ SOLN
INTRAMUSCULAR | Status: DC | PRN
  Administered 2017-01-25: 1 mg via INTRAVENOUS

## 2017-01-25 MED ORDER — CEFOTETAN DISODIUM-DEXTROSE 2-2.08 GM-%(50ML) IV SOLR
INTRAVENOUS | Status: AC
  Filled 2017-01-25: qty 50

## 2017-01-25 MED ORDER — ROCURONIUM BROMIDE 100 MG/10ML IV SOLN
INTRAVENOUS | Status: DC | PRN
  Administered 2017-01-25: 50 mg via INTRAVENOUS

## 2017-01-25 MED ORDER — PROMETHAZINE HCL 25 MG/ML IJ SOLN: 6.2500 mg | INTRAMUSCULAR | Status: DC | PRN

## 2017-01-25 MED ORDER — SCOPOLAMINE 1 MG/3DAYS TD PT72
MEDICATED_PATCH | TRANSDERMAL | Status: AC
  Administered 2017-01-25: 1.5 mg via TRANSDERMAL
  Filled 2017-01-25: qty 1

## 2017-01-25 MED ORDER — BUTORPHANOL TARTRATE 1 MG/ML IJ SOLN: 1.0000 mg | INTRAMUSCULAR | Status: DC | PRN

## 2017-01-25 MED ORDER — ONDANSETRON HCL 4 MG PO TABS: 4.0000 mg | ORAL_TABLET | Freq: Four times a day (QID) | ORAL | Status: DC | PRN

## 2017-01-25 MED ORDER — KETOROLAC TROMETHAMINE 30 MG/ML IJ SOLN
30.0000 mg | Freq: Four times a day (QID) | INTRAMUSCULAR | Status: DC
  Administered 2017-01-25 – 2017-01-26 (×3): 30 mg via INTRAVENOUS
  Filled 2017-01-25 (×3): qty 1

## 2017-01-25 MED ORDER — HYDROMORPHONE HCL 1 MG/ML IJ SOLN
INTRAMUSCULAR | Status: AC
  Filled 2017-01-25: qty 1

## 2017-01-25 MED ORDER — GABAPENTIN 300 MG PO CAPS
300.0000 mg | ORAL_CAPSULE | Freq: Once | ORAL | Status: AC
  Administered 2017-01-25: 300 mg via ORAL

## 2017-01-25 MED ORDER — SUGAMMADEX SODIUM 200 MG/2ML IV SOLN
INTRAVENOUS | Status: DC | PRN
  Administered 2017-01-25: 172.4 mg via INTRAVENOUS

## 2017-01-25 MED ORDER — PHENYLEPHRINE HCL 10 MG/ML IJ SOLN
INTRAMUSCULAR | Status: DC | PRN
  Administered 2017-01-25: 40 mg via INTRAVENOUS

## 2017-01-25 MED ORDER — SCOPOLAMINE 1 MG/3DAYS TD PT72
1.0000 | MEDICATED_PATCH | Freq: Once | TRANSDERMAL | Status: DC
  Administered 2017-01-25: 1.5 mg via TRANSDERMAL

## 2017-01-25 MED ORDER — FENTANYL CITRATE (PF) 250 MCG/5ML IJ SOLN
INTRAMUSCULAR | Status: AC
  Filled 2017-01-25: qty 5

## 2017-01-25 MED ORDER — DEXAMETHASONE SODIUM PHOSPHATE 4 MG/ML IJ SOLN
INTRAMUSCULAR | Status: AC
  Filled 2017-01-25: qty 1

## 2017-01-25 MED ORDER — CELECOXIB 200 MG PO CAPS
200.0000 mg | ORAL_CAPSULE | Freq: Once | ORAL | Status: AC
  Administered 2017-01-25: 200 mg via ORAL

## 2017-01-25 MED ORDER — FENTANYL CITRATE (PF) 100 MCG/2ML IJ SOLN
25.0000 ug | INTRAMUSCULAR | Status: DC | PRN
  Administered 2017-01-25: 25 ug via INTRAVENOUS
  Administered 2017-01-25: 50 ug via INTRAVENOUS

## 2017-01-25 MED ORDER — KETOROLAC TROMETHAMINE 30 MG/ML IJ SOLN: 30.0000 mg | Freq: Once | INTRAMUSCULAR | Status: DC

## 2017-01-25 MED ORDER — DEXTROSE IN LACTATED RINGERS 5 % IV SOLN
INTRAVENOUS | Status: DC
  Administered 2017-01-25: 11:00:00 via INTRAVENOUS

## 2017-01-25 MED ORDER — CELECOXIB 200 MG PO CAPS
ORAL_CAPSULE | ORAL | Status: AC
  Filled 2017-01-25: qty 1

## 2017-01-25 MED ORDER — SODIUM CHLORIDE 0.9% FLUSH: 9.0000 mL | INTRAVENOUS | Status: DC | PRN

## 2017-01-25 MED ORDER — FENTANYL CITRATE (PF) 100 MCG/2ML IJ SOLN
INTRAMUSCULAR | Status: DC | PRN
  Administered 2017-01-25: 50 ug via INTRAVENOUS
  Administered 2017-01-25 (×2): 100 ug via INTRAVENOUS

## 2017-01-25 MED ORDER — ONDANSETRON HCL 4 MG/2ML IJ SOLN
4.0000 mg | Freq: Four times a day (QID) | INTRAMUSCULAR | Status: DC | PRN
  Administered 2017-01-25: 4 mg via INTRAVENOUS
  Filled 2017-01-25: qty 2

## 2017-01-25 MED ORDER — GLYCOPYRROLATE 0.2 MG/ML IJ SOLN
INTRAMUSCULAR | Status: DC | PRN
  Administered 2017-01-25: 0.1 mg via INTRAVENOUS

## 2017-01-25 MED ORDER — PHENYLEPHRINE 40 MCG/ML (10ML) SYRINGE FOR IV PUSH (FOR BLOOD PRESSURE SUPPORT)
PREFILLED_SYRINGE | INTRAVENOUS | Status: AC
Start: 2017-01-25 — End: 2017-01-25
  Filled 2017-01-25: qty 10

## 2017-01-25 MED ORDER — OXYCODONE-ACETAMINOPHEN 5-325 MG PO TABS: 1.0000 | ORAL_TABLET | ORAL | Status: DC | PRN

## 2017-01-25 MED ORDER — CEFOTETAN DISODIUM-DEXTROSE 2-2.08 GM-%(50ML) IV SOLR
2.0000 g | INTRAVENOUS | Status: AC
  Administered 2017-01-25: 2 g via INTRAVENOUS

## 2017-01-25 MED ORDER — MORPHINE SULFATE 2 MG/ML IV SOLN
INTRAVENOUS | Status: DC
  Administered 2017-01-25: 11:00:00 via INTRAVENOUS
  Administered 2017-01-25: 0.5 mL via INTRAVENOUS
  Administered 2017-01-25: 1.5 mL via INTRAVENOUS
  Filled 2017-01-25: qty 30

## 2017-01-25 MED ORDER — ACETAMINOPHEN 500 MG PO TABS
1000.0000 mg | ORAL_TABLET | Freq: Once | ORAL | Status: AC
  Administered 2017-01-25: 1000 mg via ORAL

## 2017-01-25 MED ORDER — GABAPENTIN 300 MG PO CAPS
ORAL_CAPSULE | ORAL | Status: AC
  Filled 2017-01-25: qty 1

## 2017-01-25 MED ORDER — ROCURONIUM BROMIDE 100 MG/10ML IV SOLN
INTRAVENOUS | Status: AC
  Filled 2017-01-25: qty 1

## 2017-01-25 SURGICAL SUPPLY — 46 items
ADH SKN CLS APL DERMABOND .7 (GAUZE/BANDAGES/DRESSINGS) ×2
APL SRG 38 LTWT LNG FL B (MISCELLANEOUS) ×2
APPLICATOR ARISTA FLEXITIP XL (MISCELLANEOUS) ×3 IMPLANT
CABLE HIGH FREQUENCY MONO STRZ (ELECTRODE) IMPLANT
CATH ROBINSON RED A/P 16FR (CATHETERS) ×4 IMPLANT
CLOTH BEACON ORANGE TIMEOUT ST (SAFETY) ×4 IMPLANT
CONT PATH 16OZ SNAP LID 3702 (MISCELLANEOUS) ×4 IMPLANT
COVER BACK TABLE 60X90IN (DRAPES) ×4 IMPLANT
DECANTER SPIKE VIAL GLASS SM (MISCELLANEOUS) IMPLANT
DERMABOND ADVANCED (GAUZE/BANDAGES/DRESSINGS) ×2
DERMABOND ADVANCED .7 DNX12 (GAUZE/BANDAGES/DRESSINGS) ×2 IMPLANT
DRSG OPSITE POSTOP 3X4 (GAUZE/BANDAGES/DRESSINGS) ×3 IMPLANT
DURAPREP 26ML APPLICATOR (WOUND CARE) ×4 IMPLANT
ELECT REM PT RETURN 9FT ADLT (ELECTROSURGICAL) ×4
ELECTRODE REM PT RTRN 9FT ADLT (ELECTROSURGICAL) ×2 IMPLANT
GLOVE BIO SURGEON STRL SZ7 (GLOVE) ×8 IMPLANT
GLOVE BIOGEL PI IND STRL 6.5 (GLOVE) ×2 IMPLANT
GLOVE BIOGEL PI IND STRL 7.0 (GLOVE) ×4 IMPLANT
GLOVE BIOGEL PI INDICATOR 6.5 (GLOVE) ×2
GLOVE BIOGEL PI INDICATOR 7.0 (GLOVE) ×4
HEMOSTAT ARISTA ABSORB 3G PWDR (MISCELLANEOUS) ×3 IMPLANT
LEGGING LITHOTOMY PAIR STRL (DRAPES) ×4 IMPLANT
LIGASURE IMPACT 36 18CM CVD LR (INSTRUMENTS) ×4 IMPLANT
NEEDLE INSUFFLATION 120MM (ENDOMECHANICALS) ×4 IMPLANT
NS IRRIG 1000ML POUR BTL (IV SOLUTION) ×4 IMPLANT
PACK LAVH (CUSTOM PROCEDURE TRAY) ×4 IMPLANT
PACK ROBOTIC GOWN (GOWN DISPOSABLE) ×4 IMPLANT
PACK TRENDGUARD 450 HYBRID PRO (MISCELLANEOUS) ×1 IMPLANT
PACK TRENDGUARD 600 HYBRD PROC (MISCELLANEOUS) IMPLANT
PROTECTOR NERVE ULNAR (MISCELLANEOUS) ×8 IMPLANT
SEALER TISSUE G2 CVD JAW 45CM (ENDOMECHANICALS) ×4 IMPLANT
SET IRRIG TUBING LAPAROSCOPIC (IRRIGATION / IRRIGATOR) ×3 IMPLANT
SLEEVE XCEL OPT CAN 5 100 (ENDOMECHANICALS) ×4 IMPLANT
SUT MON AB 2-0 CT1 36 (SUTURE) ×8 IMPLANT
SUT VIC AB 0 CT1 18XCR BRD8 (SUTURE) ×2 IMPLANT
SUT VIC AB 0 CT1 36 (SUTURE) ×4 IMPLANT
SUT VIC AB 0 CT1 8-18 (SUTURE) ×4
SUT VICRYL 0 TIES 12 18 (SUTURE) ×4 IMPLANT
SUT VICRYL 4-0 PS2 18IN ABS (SUTURE) ×4 IMPLANT
TOWEL OR 17X24 6PK STRL BLUE (TOWEL DISPOSABLE) ×8 IMPLANT
TRAY FOLEY CATH SILVER 14FR (SET/KITS/TRAYS/PACK) ×4 IMPLANT
TRENDGUARD 450 HYBRID PRO PACK (MISCELLANEOUS) ×4
TRENDGUARD 600 HYBRID PROC PK (MISCELLANEOUS)
TROCAR OPTI TIP 5M 100M (ENDOMECHANICALS) ×4 IMPLANT
TROCAR XCEL DIL TIP R 11M (ENDOMECHANICALS) ×4 IMPLANT
WARMER LAPAROSCOPE (MISCELLANEOUS) ×4 IMPLANT

## 2017-01-25 NOTE — Anesthesia Procedure Notes (Signed)
Procedure Name: Intubation Date/Time: 01/25/2017 7:29 AM Performed by: Jonna Munro Pre-anesthesia Checklist: Patient identified, Emergency Drugs available, Suction available and Patient being monitored Patient Re-evaluated:Patient Re-evaluated prior to induction Oxygen Delivery Method: Circle system utilized Preoxygenation: Pre-oxygenation with 100% oxygen Induction Type: IV induction Ventilation: Mask ventilation without difficulty Laryngoscope Size: Miller and 3 Grade View: Grade I Tube type: Oral Tube size: 7.0 mm Number of attempts: 1 Airway Equipment and Method: Stylet Placement Confirmation: ETT inserted through vocal cords under direct vision,  positive ETCO2 and breath sounds checked- equal and bilateral Secured at: 21 cm Tube secured with: Tape Dental Injury: Teeth and Oropharynx as per pre-operative assessment

## 2017-01-25 NOTE — Anesthesia Postprocedure Evaluation (Signed)
Anesthesia Post Note  Patient: Barbara Scott  Procedure(s) Performed: LAPAROSCOPIC ASSISTED VAGINAL HYSTERECTOMY (N/A Abdomen)     Patient location during evaluation: PACU Anesthesia Type: General Level of consciousness: awake Pain management: pain level controlled Vital Signs Assessment: post-procedure vital signs reviewed and stable Respiratory status: spontaneous breathing Cardiovascular status: stable Postop Assessment: no apparent nausea or vomiting Anesthetic complications: no    Last Vitals:  Vitals:   01/25/17 0945 01/25/17 0953  BP: 123/71   Pulse: 61 65  Resp: 11 15  Temp:    SpO2: 98% 97%    Last Pain:  Vitals:   01/25/17 0953  TempSrc:   PainSc: 3    Pain Goal: Patients Stated Pain Goal: 3 (01/25/17 3435)               Jamyrah Saur JR,JOHN Mateo Flow

## 2017-01-25 NOTE — Transfer of Care (Signed)
Immediate Anesthesia Transfer of Care Note  Patient: Barbara Scott  Procedure(s) Performed: LAPAROSCOPIC ASSISTED VAGINAL HYSTERECTOMY (N/A Abdomen)  Patient Location: PACU  Anesthesia Type:General  Level of Consciousness: awake, alert  and oriented  Airway & Oxygen Therapy: Patient Spontanous Breathing and Patient connected to nasal cannula oxygen  Post-op Assessment: Report given to RN and Post -op Vital signs reviewed and stable  Post vital signs: Reviewed and stable  Last Vitals:  Vitals:   01/25/17 0611  BP: 126/82  Pulse: 72  Resp: 16  Temp: 36.8 C  SpO2: 100%    Last Pain:  Vitals:   01/25/17 0611  TempSrc: Oral      Patients Stated Pain Goal: 3 (75/17/00 1749)  Complications: No apparent anesthesia complications

## 2017-01-25 NOTE — Anesthesia Procedure Notes (Signed)
Anesthesia Regional Block: TAP block   Pre-Anesthetic Checklist: ,, timeout performed, Correct Patient, Correct Site, Correct Laterality, Correct Procedure, Correct Position, site marked, Risks and benefits discussed,  Surgical consent,  Pre-op evaluation,  At surgeon's request and post-op pain management  Laterality: Right and Left  Prep: chloraprep       Needles:  Injection technique: Single-shot  Needle Type: Echogenic Needle     Needle Length: 9cm  Needle Gauge: 21     Additional Needles:   Procedures:,,,, ultrasound used (permanent image in chart),,,,  Narrative:  Start time: 01/25/2017 8:40 AM End time: 01/25/2017 8:47 AM Injection made incrementally with aspirations every 5 mL.  Performed by: Personally  Anesthesiologist: Catalina Gravel  Additional Notes: No pain on injection. No increased resistance to injection. Injection made in 5cc increments.  Good needle visualization.  Patient tolerated procedure well.  Bilateral TAP blocks.

## 2017-01-25 NOTE — Progress Notes (Signed)
The patient was re-examined with no change in status, as discussed in office, will consent for poss LSO( has had prior bilat sa;lpingectomy)

## 2017-01-25 NOTE — Anesthesia Postprocedure Evaluation (Signed)
Anesthesia Post Note  Patient: Barbara Scott  Procedure(s) Performed: LAPAROSCOPIC ASSISTED VAGINAL HYSTERECTOMY (N/A Abdomen)     Patient location during evaluation: [facility] Anesthesia Type: General Level of consciousness: awake Pain management: satisfactory to patient Vital Signs Assessment: post-procedure vital signs reviewed and stable Respiratory status: spontaneous breathing Cardiovascular status: stable Anesthetic complications: no    Last Vitals:  Vitals:   01/25/17 1330 01/25/17 1357  BP: 116/76   Pulse: (!) 52   Resp: 13 15  Temp: 36.4 C   SpO2: 100% 100%    Last Pain:  Vitals:   01/25/17 1357  TempSrc:   PainSc: 3    Pain Goal: Patients Stated Pain Goal: 3 (01/25/17 1552)               Casimer Lanius

## 2017-01-25 NOTE — Addendum Note (Signed)
Addendum  created 01/25/17 1535 by Asher Muir, CRNA   Sign clinical note

## 2017-01-25 NOTE — Op Note (Addendum)
Preoperative diagnosis: Pelvic pain, dysmenorrhea  Postoperative diagnosis: Same plus pelvic endometriosis  Procedure: LAVH, LSO  Surgeon: Matthew Saras  Assistant: Julien Girt  EBL: 200 cc  Procedure and findings:  Patient was taken to the operating room after an adequate level of general anesthesia was obtained with the patient's leg stirrups the abdomen perineum and vagina were prepped and draped, the bladder was drained.  Appropriate timeouts were taken at that point.  EUA was carried out the uterus was mid position normal size mobile adnexa negative.  TENACULUM was positioned.  Attention directed to the abdomen with a 2 cm subumbilical incision was made after infiltrating with quarter percent Marcaine plain Veress needle was introduced without difficulty, its intra-abdominal position was verified by pressure testing.  After 3 L pneumoperitoneum was then created, a lap scopic trocar and sleeve were then introduced without difficulty.  3 fingerbreadths above the symphysis in the midline a 5 mm trocar was inserted under direct visualization.  The patient then placed in Trendelenburg and the pelvic findings as follows:  Uterus itself is normal size, she had a prior bilateral salpingectomy bilateral tubes and ovaries were normal at the left uterosacral ligament she did have some early stage endometriosis.  Upper abdomen otherwise unremarkable.  Starting on the left a grasping forcep was used and then grasped the left ovary which is placed on traction course of the ureter was noted to be well below the left IP ligament was then coagulated and divided down to and including the left round ligament to dissect free the left ovary on the opposite side the utero-ovarian pedicle was coagulated and divided down to and including the round ligament thus conserving the right ovary.  Once this was completed the vaginal portion of the procedure was started  Legs were extended weighted speculum position cervix grasped with  a tenaculum cervical vaginal mucosa was incised posterior culdotomy performed without difficulty the bladder was advanced superiorly until the anterior peritoneal reflection could be identified this was entered sharply and retractor was then used to gently elevate the bladder out of the field and sequential manner the uterosacral ligament, cardinal ligament, uterine vascular pedicle and upper broad ligament pedicles were clamped coagulated and divided with the LigaSure device.  The fundus was then delivered posteriorly remaining pedicles were clamped divided and free tied with 0 Vicryl suture.  Left tube and ovary were removed and attached to the uterus.  The vaginal cuff was then closed with a running locked 2-0 Vicryl suture from 3 to 9:00.  Prior to closure sponge, needle, estimate counts reported correct x2 vaginal cuff was then closed right to left with interrupted 2-0 Vicryl sutures.  This was hemostatic with Foley catheter present draining clear urine.  Repeat laparoscopy was then carried out the nasal was used to irrigate the pelvis was then aspirated pressure was reduced and noted to be hemostatic there was some minimal oozing at the cuff Arista was sprayed across the cuff and observed and noted to be hemostatic and reduced pressure.  Prior to closure sponge, needle, and sternal counts reported as correct x2 instruments were removed gas allowed to escape defect closed with 4-0 Monocryl subcuticular at the umbilicus and Dermabond on the lower incision.  She tolerated this well went to recovery room in good condition.  Dictated with Dragon Medical  Margarette Asal, MD

## 2017-01-25 NOTE — Anesthesia Preprocedure Evaluation (Addendum)
Anesthesia Evaluation  Patient identified by MRN, date of birth, ID band Patient awake    Reviewed: Allergy & Precautions, NPO status , Patient's Chart, lab work & pertinent test results  Airway Mallampati: I  TM Distance: >3 FB Neck ROM: Full    Dental  (+) Teeth Intact, Dental Advisory Given   Pulmonary neg pulmonary ROS,    Pulmonary exam normal breath sounds clear to auscultation       Cardiovascular hypertension, Normal cardiovascular exam Rhythm:Regular Rate:Normal     Neuro/Psych  Headaches,    GI/Hepatic negative GI ROS, Neg liver ROS,   Endo/Other  Obesity   Renal/GU negative Renal ROS     Musculoskeletal negative musculoskeletal ROS (+)   Abdominal   Peds  Hematology negative hematology ROS (+)   Anesthesia Other Findings Day of surgery medications reviewed with the patient.  Reproductive/Obstetrics Dysmenorrhea                            Anesthesia Physical Anesthesia Plan  ASA: II  Anesthesia Plan: General   Post-op Pain Management:    Induction: Intravenous  PONV Risk Score and Plan: 4 or greater and Ondansetron, Dexamethasone, Midazolam, Scopolamine patch - Pre-op and Promethazine  Airway Management Planned: Oral ETT  Additional Equipment:   Intra-op Plan:   Post-operative Plan: Extubation in OR  Informed Consent: I have reviewed the patients History and Physical, chart, labs and discussed the procedure including the risks, benefits and alternatives for the proposed anesthesia with the patient or authorized representative who has indicated his/her understanding and acceptance.   Dental advisory given  Plan Discussed with: CRNA  Anesthesia Plan Comments: (Bilateral TAP block in OR before emergence)       Anesthesia Quick Evaluation

## 2017-01-26 ENCOUNTER — Encounter (HOSPITAL_COMMUNITY): Payer: Self-pay | Admitting: Obstetrics and Gynecology

## 2017-01-26 DIAGNOSIS — N8302 Follicular cyst of left ovary: Secondary | ICD-10-CM | POA: Diagnosis not present

## 2017-01-26 DIAGNOSIS — N946 Dysmenorrhea, unspecified: Secondary | ICD-10-CM | POA: Diagnosis not present

## 2017-01-26 DIAGNOSIS — E669 Obesity, unspecified: Secondary | ICD-10-CM | POA: Diagnosis not present

## 2017-01-26 DIAGNOSIS — Z6831 Body mass index (BMI) 31.0-31.9, adult: Secondary | ICD-10-CM | POA: Diagnosis not present

## 2017-01-26 DIAGNOSIS — N858 Other specified noninflammatory disorders of uterus: Secondary | ICD-10-CM | POA: Diagnosis not present

## 2017-01-26 DIAGNOSIS — R102 Pelvic and perineal pain: Secondary | ICD-10-CM | POA: Diagnosis not present

## 2017-01-26 DIAGNOSIS — N888 Other specified noninflammatory disorders of cervix uteri: Secondary | ICD-10-CM | POA: Diagnosis not present

## 2017-01-26 DIAGNOSIS — I10 Essential (primary) hypertension: Secondary | ICD-10-CM | POA: Diagnosis not present

## 2017-01-26 LAB — CBC
HCT: 29.5 % — ABNORMAL LOW (ref 36.0–46.0)
Hemoglobin: 9.8 g/dL — ABNORMAL LOW (ref 12.0–15.0)
MCH: 28.3 pg (ref 26.0–34.0)
MCHC: 33.2 g/dL (ref 30.0–36.0)
MCV: 85.3 fL (ref 78.0–100.0)
PLATELETS: 232 10*3/uL (ref 150–400)
RBC: 3.46 MIL/uL — ABNORMAL LOW (ref 3.87–5.11)
RDW: 14.7 % (ref 11.5–15.5)
WBC: 12 10*3/uL — AB (ref 4.0–10.5)

## 2017-01-26 MED ORDER — IBUPROFEN 800 MG PO TABS: 800.0000 mg | ORAL_TABLET | Freq: Three times a day (TID) | ORAL | 1 refills | Status: DC | PRN

## 2017-01-26 MED ORDER — OXYCODONE-ACETAMINOPHEN 5-325 MG PO TABS: 1.0000 | ORAL_TABLET | ORAL | 0 refills | Status: DC | PRN

## 2017-01-26 NOTE — Progress Notes (Signed)
Pt ambulated  To  L&D  Teaching complete to d/c home with husband

## 2017-01-26 NOTE — Discharge Summary (Signed)
Physician Discharge Summary  Patient ID: Barbara Scott MRN: [id number] DOB/AGE: 12/26/1985 31 y.o.  Admit date: 01/25/2017 Discharge date: 01/26/2017  Admission [facility]  Discharge Diagnoses: SAME + PELVIC ENDOMETRIOSIS Active Problems:   Dysmenorrhea   Discharged Condition: good  Hospital Course: ADM for LAVH + LSO for dysmenorrhea, on POD 1, was afeb, tol PO and ready for D/C  Consults: None  Significant Diagnostic Studies: labs:  CBC    Component Value Date/Time   WBC 12.0 (H) 01/26/2017 0458   RBC 3.46 (L) 01/26/2017 0458   HGB 9.8 (L) 01/26/2017 0458   HCT 29.5 (L) 01/26/2017 0458   PLT 232 01/26/2017 0458   MCV 85.3 01/26/2017 0458   MCH 28.3 01/26/2017 0458   MCHC 33.2 01/26/2017 0458   RDW 14.7 01/26/2017 0458   LYMPHSABS 3.6 01/27/2016 1921   MONOABS 0.4 01/27/2016 1921   EOSABS 0.1 01/27/2016 1921   BASOSABS 0.0 01/27/2016 1921     Treatments: surgery: LAVH+ L oophorectomy  Discharge Exam: Blood pressure (!) 100/58, pulse (!) 53, temperature 98.8 F (37.1 C), temperature source Oral, resp. rate 18, height 5\' 5"  (1.651 m), weight 190 lb (86.2 kg), last menstrual period 01/20/2017, SpO2 97 %, unknown if currently breastfeeding. abd soft + BS, Incs C/D  Disposition: 01-Home or Self Care    Follow-up Information    Molli Posey, MD. Schedule an appointment as soon as possible for a visit in 1 week(s).   Specialty:  Obstetrics and Gynecology Contact information: [place] [address] [phone]           Signed: Margarette Asal 01/26/2017, 8:13 AM

## 2017-04-26 DIAGNOSIS — E669 Obesity, unspecified: Secondary | ICD-10-CM | POA: Diagnosis not present

## 2017-06-10 DIAGNOSIS — M62838 Other muscle spasm: Secondary | ICD-10-CM | POA: Diagnosis not present

## 2017-06-10 DIAGNOSIS — E669 Obesity, unspecified: Secondary | ICD-10-CM | POA: Diagnosis not present

## 2017-06-10 DIAGNOSIS — G43719 Chronic migraine without aura, intractable, without status migrainosus: Secondary | ICD-10-CM | POA: Diagnosis not present

## 2018-03-16 DIAGNOSIS — Z808 Family history of malignant neoplasm of other organs or systems: Secondary | ICD-10-CM | POA: Diagnosis not present

## 2018-03-16 DIAGNOSIS — Z8042 Family history of malignant neoplasm of prostate: Secondary | ICD-10-CM | POA: Diagnosis not present

## 2018-03-16 DIAGNOSIS — Z6831 Body mass index (BMI) 31.0-31.9, adult: Secondary | ICD-10-CM | POA: Diagnosis not present

## 2018-03-16 DIAGNOSIS — Z01419 Encounter for gynecological examination (general) (routine) without abnormal findings: Secondary | ICD-10-CM | POA: Diagnosis not present

## 2018-03-16 DIAGNOSIS — Z801 Family history of malignant neoplasm of trachea, bronchus and lung: Secondary | ICD-10-CM | POA: Diagnosis not present

## 2018-06-28 ENCOUNTER — Telehealth: Payer: Self-pay | Admitting: Genetic Counselor

## 2018-06-28 ENCOUNTER — Encounter: Payer: Self-pay | Admitting: Gastroenterology

## 2018-06-28 NOTE — Telephone Encounter (Signed)
Received a genetic counseling referral from Dr. Arta Silence for [facility] heterozygous mutation. Pt has been cld and scheduled to see Roma Kayser on 6/3 at 9am. Letter mailed.

## 2018-09-01 ENCOUNTER — Telehealth: Payer: Self-pay | Admitting: Genetic Counselor

## 2018-09-01 NOTE — Telephone Encounter (Signed)
Called patient regarding upcoming Webex appointment, patient would prefer this to be a walk-in visit and wants to keep the labs.

## 2018-09-07 ENCOUNTER — Inpatient Hospital Stay: Payer: No Typology Code available for payment source | Attending: Genetic Counselor | Admitting: Genetic Counselor

## 2018-09-07 ENCOUNTER — Inpatient Hospital Stay: Payer: No Typology Code available for payment source

## 2018-09-07 ENCOUNTER — Encounter: Payer: Self-pay | Admitting: Genetic Counselor

## 2018-09-07 ENCOUNTER — Other Ambulatory Visit: Payer: Self-pay

## 2018-09-07 DIAGNOSIS — Z315 Encounter for genetic counseling: Secondary | ICD-10-CM

## 2018-09-07 DIAGNOSIS — Z801 Family history of malignant neoplasm of trachea, bronchus and lung: Secondary | ICD-10-CM

## 2018-09-07 DIAGNOSIS — Z803 Family history of malignant neoplasm of breast: Secondary | ICD-10-CM | POA: Insufficient documentation

## 2018-09-07 DIAGNOSIS — Z8042 Family history of malignant neoplasm of prostate: Secondary | ICD-10-CM | POA: Diagnosis not present

## 2018-09-07 DIAGNOSIS — Z808 Family history of malignant neoplasm of other organs or systems: Secondary | ICD-10-CM

## 2018-09-07 NOTE — Progress Notes (Signed)
REFERRING PROVIDER: Arta Silence, MD [phone] [place], [place] [address]  PRIMARY PROVIDER:  Blair Heys, [place]   Molli Posey, MD  PRIMARY REASON FOR VISIT:  1. Family history of breast cancer   2. Family history of prostate cancer   3. Family history of melanoma      HISTORY OF PRESENT ILLNESS:   Ms. Menn, a 33 y.o. female, was seen for a [place] cancer genetics consultation at the request of Dr. Paulita Fujita due to a family history of cancer.  Ms. Nowaczyk presents to clinic today to discuss the possibility of a hereditary predisposition to cancer, genetic testing, and to further clarify her future cancer risks, as well as potential cancer risks for family members.   Ms. Peedin is a 33 y.o. female with no personal history of cancer.  Based on her family history of cancer, she [facility] at 34 [facility].  Testing was performed through [facility]  The [facility] gene panel offered by [facility] includes sequencing and deletion/duplication testing of the following 35 genes: APC, ATM, AXIN2, BARD1, BMPR1A, BRCA1, BRCA2, BRIP1, CHD1, CDK4, CDKN2A, CHEK2, EPCAM (large rearrangement only), HOXB13, (sequencing only), GALNT12, MLH1, MSH2, MSH3 (excluding repetitive portions of exon 1), MSH6, MUTYH, NBN, NTHL1, PALB2, PMS2, PTEN, RAD51C, RAD51D, RNF43, RPS20, SMAD4, STK11, and TP53. Sequencing was performed for select regions of POLE and POLD1, and large rearrangement analysis was performed for select regions of GREM1. Ms. 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  CANCER HISTORY:   No history exists.     RISK FACTORS:  Menarche was at age 24.  First live birth at age 22.  OCP use for approximately 6-7 years.  Ovaries intact: one.  Hysterectomy: yes.  Menopausal status: premenopausal.  HRT use: 0 years. Colonoscopy: no; not examined. Mammogram within the last year: no. Number  of breast biopsies: 0. Up to date with pelvic exams: yes. Any excessive radiation exposure in the past: no  Past Medical History:  Diagnosis Date   Anemia    with pregnancy only - resolved   Family history of breast cancer    Family history of melanoma    Family history of prostate cancer    Headache(784.0)    migraines - otc med   Pregnancy induced hypertension    with pregnancy only - resolved   Skin lesions 01/2014   chest, right axilla, abd., back   SVD (spontaneous vaginal delivery)    x 1    Past Surgical History:  Procedure Laterality Date   CESAREAN SECTION WITH BILATERAL TUBAL LIGATION Bilateral 02/05/2015   Procedure: CESAREAN SECTION WITH BILATERAL TUBAL LIGATION;  Surgeon: Molli Posey, MD;  Location: [facility];  Service: Obstetrics;  Laterality: Bilateral;   CHOLECYSTECTOMY     CHOLESTEATOMA EXCISION     DILATION AND EVACUATION N/A 01/01/2014   Procedure: DILATATION AND EVACUATION;  Surgeon: Margarette Asal, MD;  Location: [facility];  Service: Gynecology;  Laterality: N/A;   EYE SURGERY Bilateral    lasik   KNEE ARTHROSCOPY Right    LAPAROSCOPIC ASSISTED VAGINAL HYSTERECTOMY N/A 01/25/2017   Procedure: LAPAROSCOPIC ASSISTED VAGINAL HYSTERECTOMY WITH LEFT OOPHERECTOMY;  Surgeon: Molli Posey, MD;  Location: [facility];  Service: Gynecology;  Laterality: N/A;  request Dortha Kern   LESION REMOVAL N/A 02/02/2014   Procedure: MINOR EXICISION OF LESION X3  RIGHT AXILLA, CHEST AND ABDOMEN;  Surgeon: Alphonsa Overall, MD;  Location: [facility];  Service: General;  Laterality: N/A;   MELANOMA EXCISION     patient denies this procedure   NEVUS EXCISION  08/07/2004   multiple   TONSILLECTOMY     TUBAL LIGATION      Social History   Socioeconomic History   Marital status: Married    Spouse name: Not on file   Number of children: Not on file   Years of education: Not on file   Highest education level: Not on file  Occupational History     Not on file  Social Needs   Financial resource strain: Not on file   Food insecurity:    Worry: Not on file    Inability: Not on file   Transportation needs:    Medical: Not on file    Non-medical: Not on file  Tobacco Use   Smoking status: Never Smoker   Smokeless tobacco: Never Used  Substance and Sexual Activity   Alcohol use: Yes    Comment: occasional   Drug use: No   Sexual activity: Yes    Birth control/protection: None  Lifestyle   Physical activity:    Days per week: Not on file    Minutes per session: Not on file   Stress: Not on file  Relationships   Social connections:    Talks on phone: Not on file    Gets together: Not on file    Attends religious service: Not on file    Active member of club or organization: Not on file    Attends meetings of clubs or organizations: Not on file    Relationship status: Not on file  Other Topics Concern   Not on file  Social History Narrative   Not on file     FAMILY HISTORY:  We obtained a detailed, 4-generation family history.  Significant diagnoses are listed below: Family History  Problem Relation Age of Onset   Hypertension Mother    Thyroid disease Mother    Cancer Mother        melanoma   Cancer Father    Hypertension Maternal Uncle    Heart disease Maternal Uncle    Cancer Maternal Grandfather        lung prostate    The patient has two boys who are cancer free.  She has a brother who is cancer free.  Both parents are living.  The patient's mother was diagnosed with melanoma at 34.  She has mohs surgery and several lymph nodes removed. She had two full brothers, a full sister and a paternal half brother. All are cancer free.  The patient's maternal grandmother is living and her grandfather had prostate cancer.  He had a sister who had breast cancer and a mother who had breast cancer.  The patient's father had prostate cancer.  He has two brothers.  One died in a MVA at 26 and the  other is cancer free.  The paternal grandmother had pancreatic cancer in her 84's and her grandfather had prostate cancer.  Her grandfather's brother also has prostate cancer.  Ms. Ytuarte is unaware of previous family history of genetic testing for hereditary cancer risks. Patient's maternal ancestors are of Caucasian descent, and paternal ancestors are of Caucasian descent. There is no reported Ashkenazi Jewish ancestry. There is no known consanguinity.  GENETIC TEST RESULTS: Ms. Nine pursued genetic testing of the [facility] panel through Dr. Deliah Boston office. This test, which included sequencing and deletion/duplication analysis of the genes listed on  the test report, was performed at [facility]. Genetic testing identified a single, heterozygous pathogenic gene mutation called c.1187G>A.  Since Ms. Slavick has only one pathogenic mutation in MUTYH, she is NOT affected with MYH-associated polyposis, but instead is a carrier.      GENETIC COUNSELING ASSESSMENT: Ms. Golda is a 33 y.o. female with a family history of cancer and a known mutation in MUTYH which is somewhat suggestive of a hereditary cancer syndrome and predisposition to cancer. We, therefore, discussed and recommended the following at today's visit.   DISCUSSION: We discussed that she has bilineal risk for cancer and our discussion focused on both sides of her family individually.  We discussed that 5 - 10% of melanoma is hereditary, with most cases associated with CDKN2A and CDK4.  Both of these genes were on the [facility] panel, and were negative.  However, there are other genes that can be associated with hereditary melanoma cancer syndromes that were not tested, and have risks for other cancers.  These include MITF, BAP1, and POT1. There is also breast and prostate cancer on the maternal side of the family, which can be associated with hereditary breast cancer syndromes.  We discussed that 5 - 10% of  breast cancer is hereditary, with most cases associated with BRCA mutations.  There are other genes that can be associated with hereditary breast cancer syndromes.  These include ATM, CHEK2 and PALB2.  These genes can also be associated with prostate and pancreatic cancers, which can be seen on her paternal side of the family as well.  Based on her genetic testing, she was found to carry a single pathogenic mutation in MUTYH.  MUTYH is a recessive genetic condition, where you need to have two pathogenic mutations in MUTYH to be affected with MYH-Associated polyposis syndrome. MAP associated polyposis is inherited in a recessive fashion.  Therefore, each of Ms. Tobia's children has a 50% chance of inheriting her MUTYH mutation, but the chances that they are affected depend on the carrier status of their father.  We recommend that Ms. Fuqua's husband undergo genetic testing for an MUTYH mutation.  SCREENING RECOMMENDATIONS: We discussed the implications of a heterozygous MUTYH mutation for Ms. Berline Lopes, and discussed who else in the family should have genetic testing. We recommended Ms. Nery follow the most updated medical management guidelines (NCCN Guidelines v1.2020) for heterozygous MUTYH mutations; all of which are outlined below. These can be coordinated by Ms. Lipari's GI doctor or her primary provider.   Personal history of colon cancer  Follow instructions provided by your physician based on your personal history.  Do not have a personal history of colon cancer but have a parent/sibling/child with colon cancer: Colonoscopy every 5 years starting at age 20 or 56 years younger than the earliest age of onset, whichever is younger.  Do not have a personal history of colon cancer but do not have a parent/sibling/child with colon cancer: Data is uncertain on whether specialized screening is warranted..  Based on the patient's family history, a statistical model (Breast Cancer RiskScore) was used to  estimate her risk of developing breast cancer. This estimates her lifetime risk of developing breast cancer to be approximately 12.5%. This estimation does consider any genetic testing results.  The patient's lifetime breast cancer risk is a preliminary estimate based on available information using one of several models endorsed by the [employer] (ACS). The ACS recommends consideration of breast MRI screening as an adjunct to mammography for patients  at high risk (defined as 20% or greater lifetime risk). At this time, Ms. Cahn is NOT considered to be at high risk for breast cancer.  FAMILY MEMBERS: Since we now know the mutation in Ms. Berline Lopes, we can test at-risk relatives to determine whether or not they have inherited the mutation and are at increased risk for cancer. We will be happy to meet with any of the family members or refer them to a genetic counselor in their local area. To locate genetic counselors in other cities, individuals can visit the website of the [employer] (ArtistMovie.se) and Secretary/administrator for a Social worker by zip code.   Ms. Harron first degree relatives, including her parents, brother and children, have a 50% chance to have inherited this mutation. Her children are young and this will not be of any consequence to them for several years.We do not test children because there is no risk to them until they are adults. We recommend they have genetic counseling and testing by the time they are in their early 20's.  We do recommend that her parents and brother undergo genetic testing for MUTYH mutations.  We discussed that her bother should undergo genetic testing, not only for this same MUTYH mutation, but also for other mutations, as we cannot guarantee that her parents do not have another mutation.   We discussed that both of her parents meet medical criteria fort their own full panel genetic test.  Her mother has melanoma, and should undergo genetic  testing for more than just the common CDKN2A gene.  PLAN: Based on Ms. Mcvicker's family history, we recommended her mother, who was diagnosed with melanoma at age 77, and her father who was diagnosed with prostate cancer at age 32, have genetic counseling and testing. Ms. Jabbour will let us know if we can be of any assistance in coordinating genetic counseling and/or testing for these family members.  Lastly, we encouraged Ms. Rosenau to remain in contact with cancer genetics annually so that we can continuously update the family history and inform her of any changes in cancer genetics and testing that may be of benefit for this family.   Ms. Sanderlin questions were answered to her satisfaction today. Our contact information was provided should additional questions or concerns arise. Thank you for the referral and allowing [place] to share in the care of your patient.   Kaelei Wheeler P. Florene Glen, [place], [facility] Certified Genetic Counselor [email or web address] phone: [phone]  The patient was seen for a total of 45 minutes in face-to-face genetic counseling.  This patient was discussed with Drs. Magrinat, Lindi Adie and/or Burr Medico who agrees with the above.    _______________________________________________________________________ For Office Staff:  Number of people involved in session: 1 Was an Intern/ student involved with case: no

## 2019-01-29 ENCOUNTER — Telehealth: Payer: Self-pay | Admitting: Obstetrics and Gynecology

## 2019-01-29 MED ORDER — PHENAZOPYRIDINE HCL 200 MG PO TABS: 200.0000 mg | ORAL_TABLET | Freq: Three times a day (TID) | ORAL | 0 refills | Status: DC | PRN

## 2019-01-29 MED ORDER — SULFAMETHOXAZOLE-TRIMETHOPRIM 400-80 MG PO TABS: 1.0000 | ORAL_TABLET | Freq: Two times a day (BID) | ORAL | 0 refills | Status: DC

## 2019-01-29 NOTE — Telephone Encounter (Signed)
UTI symptoms with pressure, urgency. Rx for septra and pyridium sent to [facility].

## 2019-01-30 NOTE — Telephone Encounter (Signed)
UTI symptoms noted, pressure and discomfort.  We will send in prescription for Septra and Pyridium

## 2019-08-09 ENCOUNTER — Encounter (HOSPITAL_COMMUNITY): Payer: Self-pay | Admitting: Emergency Medicine

## 2019-08-09 ENCOUNTER — Ambulatory Visit (HOSPITAL_COMMUNITY)
Admission: EM | Admit: 2019-08-09 | Discharge: 2019-08-09 | Disposition: A | Discharge status: Home / Self Care | Payer: No Typology Code available for payment source | Attending: Family Medicine | Admitting: Family Medicine

## 2019-08-09 ENCOUNTER — Other Ambulatory Visit: Payer: Self-pay

## 2019-08-09 DIAGNOSIS — N10 Acute tubulo-interstitial nephritis: Secondary | ICD-10-CM | POA: Insufficient documentation

## 2019-08-09 DIAGNOSIS — E860 Dehydration: Secondary | ICD-10-CM | POA: Diagnosis present

## 2019-08-09 LAB — CBC WITH DIFFERENTIAL/PLATELET
Abs Immature Granulocytes: 0.17 10*3/uL — ABNORMAL HIGH (ref 0.00–0.07)
Basophils Absolute: 0.1 10*3/uL (ref 0.0–0.1)
Basophils Relative: 0 %
Eosinophils Absolute: 0.1 10*3/uL (ref 0.0–0.5)
Eosinophils Relative: 1 %
HCT: 41.4 % (ref 36.0–46.0)
Hemoglobin: 13.4 g/dL (ref 12.0–15.0)
Immature Granulocytes: 1 %
Lymphocytes Relative: 6 %
Lymphs Abs: 1.4 10*3/uL (ref 0.7–4.0)
MCH: 28.4 pg (ref 26.0–34.0)
MCHC: 32.4 g/dL (ref 30.0–36.0)
MCV: 87.7 fL (ref 80.0–100.0)
Monocytes Absolute: 2.3 10*3/uL — ABNORMAL HIGH (ref 0.1–1.0)
Monocytes Relative: 11 %
Neutro Abs: 17.7 10*3/uL — ABNORMAL HIGH (ref 1.7–7.7)
Neutrophils Relative %: 81 %
Platelets: 240 10*3/uL (ref 150–400)
RBC: 4.72 MIL/uL (ref 3.87–5.11)
RDW: 13.3 % (ref 11.5–15.5)
WBC: 21.6 10*3/uL — ABNORMAL HIGH (ref 4.0–10.5)
nRBC: 0 % (ref 0.0–0.2)

## 2019-08-09 LAB — COMPREHENSIVE METABOLIC PANEL
ALT: 20 U/L (ref 0–44)
AST: 20 U/L (ref 15–41)
Albumin: 3.4 g/dL — ABNORMAL LOW (ref 3.5–5.0)
Alkaline Phosphatase: 78 U/L (ref 38–126)
Anion gap: 10 (ref 5–15)
BUN: 11 mg/dL (ref 6–20)
CO2: 24 mmol/L (ref 22–32)
Calcium: 8.9 mg/dL (ref 8.9–10.3)
Chloride: 104 mmol/L (ref 98–111)
Creatinine, Ser: 1.16 mg/dL — ABNORMAL HIGH (ref 0.44–1.00)
GFR calc Af Amer: >60 mL/min (ref >60)
GFR calc non Af Amer: >60 mL/min (ref >60)
Glucose, Bld: 119 mg/dL — ABNORMAL HIGH (ref 70–99)
Potassium: 3.9 mmol/L (ref 3.5–5.1)
Sodium: 138 mmol/L (ref 135–145)
Total Bilirubin: 1.5 mg/dL — ABNORMAL HIGH (ref 0.3–1.2)
Total Protein: 6.8 g/dL (ref 6.5–8.1)

## 2019-08-09 LAB — POCT URINALYSIS DIP (DEVICE)
Glucose, UA: NEGATIVE mg/dL
Ketones, ur: 15 mg/dL — AB
Leukocytes,Ua: NEGATIVE
Nitrite: NEGATIVE
Protein, ur: >=300 mg/dL — AB
Specific Gravity, Urine: 1.02 (ref 1.005–1.030)
Urobilinogen, UA: 2 mg/dL — ABNORMAL HIGH (ref 0.0–1.0)
pH: 6 (ref 5.0–8.0)

## 2019-08-09 MED ORDER — ONDANSETRON 4 MG PO TBDP
ORAL_TABLET | ORAL | Status: AC
  Filled 2019-08-09: qty 1

## 2019-08-09 MED ORDER — ONDANSETRON 4 MG PO TBDP: 4.0000 mg | ORAL_TABLET | Freq: Three times a day (TID) | ORAL | 0 refills | Status: DC | PRN

## 2019-08-09 MED ORDER — CIPROFLOXACIN HCL 500 MG PO TABS: 500.0000 mg | ORAL_TABLET | Freq: Two times a day (BID) | ORAL | 0 refills | Status: DC

## 2019-08-09 MED ORDER — SODIUM CHLORIDE 0.9 % IV SOLN
1.0000 g | Freq: Once | INTRAVENOUS | Status: AC
  Administered 2019-08-09: 1 g via INTRAVENOUS
  Filled 2019-08-09: qty 10

## 2019-08-09 MED ORDER — ONDANSETRON 4 MG PO TBDP
4.0000 mg | ORAL_TABLET | Freq: Once | ORAL | Status: AC
  Administered 2019-08-09: 14:00:00 4 mg via ORAL

## 2019-08-09 MED ORDER — SODIUM CHLORIDE 0.9 % IV BOLUS
1000.0000 mL | Freq: Once | INTRAVENOUS | Status: AC
  Administered 2019-08-09: 1000 mL via INTRAVENOUS

## 2019-08-09 NOTE — ED Triage Notes (Signed)
Flank pain started Sunday. Saw her OB yesterday, diagnosed with UTI, started on antibiotic and given toradol shot. Today, she is febrile, diaphoretic, with increased pain.   She has had augmentin 3 doses so far.

## 2019-08-09 NOTE — ED Notes (Signed)
Bed: UC08 Expected date: 08/09/19 Expected time: 12:46 PM Means of arrival:  Comments: [employer]

## 2019-08-09 NOTE — ED Notes (Signed)
C. Elisabeth Cara RN notified/requested Rocephin IVPB from [employer] [employer] [facility]

## 2019-08-10 LAB — URINE CULTURE: Culture: NO GROWTH

## 2019-08-12 NOTE — ED Provider Notes (Signed)
[place]    ASSESSMENT & PLAN:  1. Acute pyelonephritis   2. Dehydration     Reports improvement after IVF. Will watch closely over the next 24-48 hours. ED if worsening/not-improving. Stop Augmentin. Begin Cipro.  Meds ordered this encounter  Medications  . sodium chloride 0.9 % bolus 1,000 mL  . cefTRIAXone (ROCEPHIN) 1 g in sodium chloride 0.9 % 100 mL IVPB    Order Specific Question:   Antibiotic Indication:    Answer:   Other Indication (list below)    Order Specific Question:   Other Indication:    Answer:   pyelonephritis  . ondansetron (ZOFRAN-ODT) disintegrating tablet 4 mg  . ciprofloxacin (CIPRO) 500 MG tablet    Sig: Take 1 tablet (500 mg total) by mouth every 12 (twelve) hours.    Dispense:  14 tablet    Refill:  0  . ondansetron (ZOFRAN-ODT) 4 MG disintegrating tablet    Sig: Take 1 tablet (4 mg total) by mouth every 8 (eight) hours as needed for nausea or vomiting.    Dispense:  15 tablet    Refill:  0    Urine culture sent. Will notify patient of any significant results. Will follow up with her PCP or here if not showing improvement over the next 48 hours, sooner if needed.  Outlined signs and symptoms indicating need for more acute intervention. Patient verbalized understanding. After Visit Summary given.  SUBJECTIVE:  Barbara Scott is a 34 y.o. female who was diagnosed with a UTI yesterday; place on Augmentin. Has taken three doses. Reports subj fever and diaphoreses. Much fatigue. Increasing flank discomfort. Overall decreased PO intake. Nausea is fairly persistent. Without gross hematuria. Ambulatory without difficulty. No significant abdominal pain.  LMP: Patient's last menstrual period was 01/20/2017 (exact date).  OBJECTIVE:  Vitals:   08/09/19 1328 08/09/19 1542  BP: 113/64   Pulse: (!) 105 80  Resp: 18 16  Temp: (!) 100.5 F (38.1 C) 99.6 F (37.6 C)  TempSrc: Oral Oral  SpO2: 98% 100%   Low grade temp  noted.  General appearance: alert; no distress; appears fatigued HENT: oropharynx: dry CV: reg; tachycardia Lungs: unlabored respirations Abdomen: soft, non-tender; no guarding or rebound tenderness Back: some CVA tenderness Extremities: no edema; symmetrical with no gross deformities Skin: warm and dry Neurologic: normal gait Psychological: alert and cooperative; normal mood and affect  Labs Reviewed  CBC WITH DIFFERENTIAL/PLATELET - Abnormal; Notable for the following components:      Result Value   WBC 21.6 (*)    Neutro Abs 17.7 (*)    Monocytes Absolute 2.3 (*)    Abs Immature Granulocytes 0.17 (*)    All other components within normal limits  COMPREHENSIVE METABOLIC PANEL - Abnormal; Notable for the following components:   Glucose, Bld 119 (*)    Creatinine, Ser 1.16 (*)    Albumin 3.4 (*)    Total Bilirubin 1.5 (*)    All other components within normal limits  POCT URINALYSIS DIP (DEVICE) - Abnormal; Notable for the following components:   Bilirubin Urine MODERATE (*)    Ketones, ur 15 (*)    Hgb urine dipstick SMALL (*)    Protein, ur >=300 (*)    Urobilinogen, UA 2.0 (*)    All other components within normal limits  URINE CULTURE    Allergies  Allergen Reactions  . Bee Venom Swelling  . Shrimp [Shellfish Allergy] Swelling    FACIAL SWELLING  . Betadine [Povidone Iodine] Swelling  .  Vicodin [Hydrocodone-Acetaminophen] Itching    Pt states that she takes percocet without reaction    Past Medical History:  Diagnosis Date  . Anemia    with pregnancy only - resolved  . Family history of breast cancer   . Family history of melanoma   . Family history of prostate cancer   . Headache(784.0)    migraines - otc med  . Pregnancy induced hypertension    with pregnancy only - resolved  . Skin lesions 01/2014   chest, right axilla, abd., back  . SVD (spontaneous vaginal delivery)    x 1   Social History   Socioeconomic History  . Marital status: Married     Spouse name: Not on file  . Number of children: Not on file  . Years of education: Not on file  . Highest education level: Not on file  Occupational History  . Not on file  Tobacco Use  . Smoking status: Never Smoker  . Smokeless tobacco: Never Used  Substance and Sexual Activity  . Alcohol use: Yes    Comment: occasional  . Drug use: No  . Sexual activity: Yes    Birth control/protection: None  Other Topics Concern  . Not on file  Social History Narrative  . Not on file   Social Determinants of Health   Financial Resource Strain:   . Difficulty of Paying Living Expenses:   Food Insecurity:   . Worried About Charity fundraiser in the Last Year:   . Arboriculturist in the Last Year:   Transportation Needs:   . Film/video editor (Medical):   Marland Kitchen Lack of Transportation (Non-Medical):   Physical Activity:   . Days of Exercise per Week:   . Minutes of Exercise per Session:   Stress:   . Feeling of Stress :   Social Connections:   . Frequency of Communication with Friends and Family:   . Frequency of Social Gatherings with Friends and Family:   . Attends Religious Services:   . Active Member of Clubs or Organizations:   . Attends Archivist Meetings:   Marland Kitchen Marital Status:   Intimate Partner Violence:   . Fear of Current or Ex-Partner:   . Emotionally Abused:   Marland Kitchen Physically Abused:   . Sexually Abused:    Family History  Problem Relation Age of Onset  . Hypertension Mother   . Thyroid disease Mother   . Cancer Mother        melanoma  . Cancer Father   . Hypertension Maternal Uncle   . Heart disease Maternal Uncle   . Cancer Maternal Grandfather        lung prostate       Vanessa Kick, MD 08/12/19 1105

## 2019-09-21 ENCOUNTER — Other Ambulatory Visit: Payer: Self-pay

## 2019-09-21 ENCOUNTER — Encounter: Payer: Self-pay | Admitting: Osteopathic Medicine

## 2019-09-21 ENCOUNTER — Ambulatory Visit (INDEPENDENT_AMBULATORY_CARE_PROVIDER_SITE_OTHER): Payer: No Typology Code available for payment source | Admitting: Osteopathic Medicine

## 2019-09-21 VITALS — BP 124/85 | HR 66 | Temp 98.2°F | Ht 65.0 in | Wt 203.1 lb

## 2019-09-21 DIAGNOSIS — R635 Abnormal weight gain: Secondary | ICD-10-CM | POA: Diagnosis not present

## 2019-09-21 DIAGNOSIS — Z0000 Encounter for general adult medical examination without abnormal findings: Secondary | ICD-10-CM | POA: Diagnosis not present

## 2019-09-21 DIAGNOSIS — Z8759 Personal history of other complications of pregnancy, childbirth and the puerperium: Secondary | ICD-10-CM

## 2019-09-21 LAB — CBC
HCT: 40.4 % (ref 35.0–45.0)
Hemoglobin: 13.1 g/dL (ref 11.7–15.5)
MCH: 27.6 pg (ref 27.0–33.0)
MCHC: 32.4 g/dL (ref 32.0–36.0)
MCV: 85.2 fL (ref 80.0–100.0)
MPV: 11.8 fL (ref 7.5–12.5)
Platelets: 294 10*3/uL (ref 140–400)
RBC: 4.74 10*6/uL (ref 3.80–5.10)
RDW: 13.2 % (ref 11.0–15.0)
WBC: 9.2 10*3/uL (ref 3.8–10.8)

## 2019-09-21 LAB — COMPLETE METABOLIC PANEL WITH GFR
AG Ratio: 2 (calc) (ref 1.0–2.5)
ALT: 11 U/L (ref 6–29)
AST: 14 U/L (ref 10–30)
Albumin: 4.5 g/dL (ref 3.6–5.1)
Alkaline phosphatase (APISO): 63 U/L (ref 31–125)
BUN: 16 mg/dL (ref 7–25)
CO2: 26 mmol/L (ref 20–32)
Calcium: 9.2 mg/dL (ref 8.6–10.2)
Chloride: 105 mmol/L (ref 98–110)
Creat: 0.83 mg/dL (ref 0.50–1.10)
GFR, Est African American: 107 mL/min/{1.73_m2} (ref >=60)
GFR, Est Non African American: 92 mL/min/{1.73_m2} (ref >=60)
Globulin: 2.2 g/dL (calc) (ref 1.9–3.7)
Glucose, Bld: 85 mg/dL (ref 65–99)
Potassium: 4.2 mmol/L (ref 3.5–5.3)
Sodium: 140 mmol/L (ref 135–146)
Total Bilirubin: 0.8 mg/dL (ref 0.2–1.2)
Total Protein: 6.7 g/dL (ref 6.1–8.1)

## 2019-09-21 LAB — LIPID PANEL
Cholesterol: 152 mg/dL (ref <200)
HDL: 49 mg/dL — ABNORMAL LOW (ref >=50)
LDL Cholesterol (Calc): 87 mg/dL (calc)
Non-HDL Cholesterol (Calc): 103 mg/dL (calc) (ref <130)
Total CHOL/HDL Ratio: 3.1 (calc) (ref <5.0)
Triglycerides: 74 mg/dL (ref <150)

## 2019-09-21 LAB — TSH: TSH: 1.48 mIU/L

## 2019-09-21 NOTE — Progress Notes (Signed)
Barbara Scott is a 34 y.o. female who presents to  [place] at [facility]  today, 09/21/19, seeking care for the following:  Establish care   Needs physical / labs   Weight - no success w/ weight loss despite varios diet/exercise, eating more or less doesn't seem to budge weight one way or there other. Intolerant / ineffective phentermine, intolerant / ineffective Qsymia      ASSESSMENT & PLAN with other pertinent history/findings:  The primary encounter diagnosis was Annual physical exam. Diagnoses of History of gestational hypertension and Abnormal weight gain were also pertinent to this visit.    Patient Instructions  Weight: If labs ok, will start saxenda or weekly equivalent  General Preventive Care  Most recent routine screening labs: ordered today.   Blood pressure goal 130/80 or less.   Tobacco: don't!   Alcohol: responsible moderation is ok for most adults - if you have concerns about your alcohol intake, please talk to me!   Exercise: as tolerated to reduce risk of cardiovascular disease and diabetes. Strength training will also prevent osteoporosis.   Mental health: if need for mental health care (medicines, counseling, other), or concerns about moods, please let me know!   Sexual health: if need for STD testing, or if concerns with libido/pain problems, please let me know!   Advanced Directive: Living Will and/or Healthcare Power of Attorney recommended for all adults, regardless of age or health.  Vaccines  Flu vaccine: for almost everyone, every fall.   Shingles vaccine: after age 5.   Pneumonia vaccines: after age 26  Tetanus booster: every 10 years - due 2024  HPV vaccine: Gardasil up to age 69 optional  COVID vaccine: THANKS for getting your vaccine! :) Cancer screenings   Colon cancer screening: for everyone age 34-75. Colonoscopy for you at 57  Breast cancer screening: mammogram at age 7    Cervical cancer screening: Pap not needed   Lung cancer screening: not needed for non-smokers  Infection screenings   HIV: recommended screening at least once age 79-65  Gonorrhea/Chlamydia: screening as needed  Hepatitis C: recommended once for everyone age 19-37  TB: certain at-risk populations, or depending on work requirements and/or travel history Other  Bone Density Test: recommended at age 69    Orders Placed This Encounter  Procedures   [place] GFR   Lipid panel   TSH    No orders of the defined types were placed in this encounter.  Detailed physical exam normal     Follow-up instructions: Return in about 1 year (around 09/20/2020) for [place] (call week prior to visit for lab orders).                                         BP 124/85 (BP Location: Right Arm, Patient Position: Sitting, Cuff Size: Large)    Pulse 66    Temp 98.2 F (36.8 C) (Oral)    Ht 5\' 5"  (1.651 m)    Wt 203 lb 1.9 oz (92.1 kg)    LMP 01/20/2017 (Exact Date)    BMI 33.80 kg/m   No outpatient medications have been marked as taking for the 09/21/19 encounter (Office Visit) with Emeterio Reeve, DO.    Results for orders placed or performed in visit on 09/21/19 (from the past 72 hour(s))  CBC  Status: None   Collection Time: 09/21/19 12:05 PM  Result Value Ref Range   WBC 9.2 3.8 - 10.8 Thousand/uL   RBC 4.74 3.80 - 5.10 Million/uL   Hemoglobin 13.1 11.7 - 15.5 g/dL   HCT 40.4 35 - 45 %   MCV 85.2 80.0 - 100.0 fL   MCH 27.6 27.0 - 33.0 pg   MCHC 32.4 32.0 - 36.0 g/dL   RDW 13.2 11.0 - 15.0 %   Platelets 294 140 - 400 Thousand/uL   MPV 11.8 7.5 - 12.5 fL  COMPLETE METABOLIC PANEL WITH GFR     Status: None   Collection Time: 09/21/19 12:05 PM  Result Value Ref Range   Glucose, Bld 85 65 - 99 mg/dL    Comment: .            Fasting reference interval .    BUN 16 7 - 25 mg/dL   Creat 0.83 0.50 - 1.10 mg/dL    GFR, Est Non African American 92 > OR = 60 mL/min/1.95m2   GFR, Est African American 107 > OR = 60 mL/min/1.11m2   BUN/Creatinine Ratio NOT APPLICABLE 6 - 22 (calc)   Sodium 140 135 - 146 mmol/L   Potassium 4.2 3.5 - 5.3 mmol/L   Chloride 105 98 - 110 mmol/L   CO2 26 20 - 32 mmol/L   Calcium 9.2 8.6 - 10.2 mg/dL   Total Protein 6.7 6.1 - 8.1 g/dL   Albumin 4.5 3.6 - 5.1 g/dL   Globulin 2.2 1.9 - 3.7 g/dL (calc)   AG Ratio 2.0 1.0 - 2.5 (calc)   Total Bilirubin 0.8 0.2 - 1.2 mg/dL   Alkaline phosphatase (APISO) 63 31 - 125 U/L   AST 14 10 - 30 U/L   ALT 11 6 - 29 U/L  Lipid panel     Status: Abnormal   Collection Time: 09/21/19 12:05 PM  Result Value Ref Range   Cholesterol 152 <200 mg/dL   HDL 49 (L) > OR = 50 mg/dL   Triglycerides 74 <150 mg/dL   LDL Cholesterol (Calc) 87 mg/dL (calc)    Comment: Reference range: <100 . Desirable range <100 mg/dL for primary prevention;   <70 mg/dL for patients with CHD or diabetic patients  with > or = 2 CHD risk factors. Marland Kitchen LDL-C is now calculated using the Martin-Hopkins  calculation, which is a validated novel method providing  better accuracy than the Friedewald equation in the  estimation of LDL-C.  Cresenciano Genre et al. Annamaria Helling. 8299;371(69): 2061-2068  (http://education.[facility].com/faq/FAQ164)    Total CHOL/HDL Ratio 3.1 <5.0 (calc)   Non-HDL Cholesterol (Calc) 103 <130 mg/dL (calc)    Comment: For patients with diabetes plus 1 major ASCVD risk  factor, treating to a non-HDL-C goal of <100 mg/dL  (LDL-C of <70 mg/dL) is considered a therapeutic  option.     No results found.  Depression screen [facility] 2/9 09/21/2019  Decreased Interest 0  Down, Depressed, Hopeless 0  PHQ - 2 Score 0  Altered sleeping 0  Tired, decreased energy 0  Change in appetite 0  Feeling bad or failure about yourself  0  Trouble concentrating 0  Moving slowly or fidgety/restless 0  Suicidal thoughts 0  PHQ-9 Score 0    GAD 7 : Generalized  Anxiety Score 09/21/2019  Nervous, Anxious, on Edge 0  Control/stop worrying 0  Worry too much - different things 0  Trouble relaxing 0  Restless 0  Easily annoyed or irritable 0  Afraid - awful might happen 0  Total GAD 7 Score 0      All questions at time of visit were answered - patient instructed to contact office with any additional concerns or updates.  ER/RTC precautions were reviewed with the patient.  Please note: voice recognition software was used to produce this document, and typos may escape review. Please contact Dr. Sheppard Coil for any needed clarifications.

## 2019-09-21 NOTE — Patient Instructions (Addendum)
Weight: If labs ok, will start saxenda or weekly equivalent  General Preventive Care  Most recent routine screening labs: ordered today.   Blood pressure goal 130/80 or less.   Tobacco: don't!   Alcohol: responsible moderation is ok for most adults - if you have concerns about your alcohol intake, please talk to me!   Exercise: as tolerated to reduce risk of cardiovascular disease and diabetes. Strength training will also prevent osteoporosis.   Mental health: if need for mental health care (medicines, counseling, other), or concerns about moods, please let me know!   Sexual health: if need for STD testing, or if concerns with libido/pain problems, please let me know!   Advanced Directive: Living Will and/or Healthcare Power of Attorney recommended for all adults, regardless of age or health.  Vaccines  Flu vaccine: for almost everyone, every fall.   Shingles vaccine: after age 29.   Pneumonia vaccines: after age 61  Tetanus booster: every 10 years - due 2024  HPV vaccine: Gardasil up to age 75 optional  COVID vaccine: THANKS for getting your vaccine! :) Cancer screenings   Colon cancer screening: for everyone age 51-75. Colonoscopy for you at 40  Breast cancer screening: mammogram at age 68   Cervical cancer screening: Pap not needed   Lung cancer screening: not needed for non-smokers  Infection screenings  . HIV: recommended screening at least once age 79-65 . Gonorrhea/Chlamydia: screening as needed . Hepatitis C: recommended once for everyone age 52-75 . TB: certain at-risk populations, or depending on work requirements and/or travel history Other . Bone Density Test: recommended at age 16

## 2019-09-22 MED ORDER — SAXENDA 18 MG/3ML ~~LOC~~ SOPN: 3.0000 mg | PEN_INJECTOR | Freq: Every day | SUBCUTANEOUS | 0 refills | Status: DC

## 2019-09-22 NOTE — Addendum Note (Signed)
Addended by: Maryla Morrow on: 09/22/2019 09:24 AM   Modules accepted: Orders

## 2019-09-27 ENCOUNTER — Encounter: Payer: Self-pay | Admitting: Osteopathic Medicine

## 2019-09-29 NOTE — Telephone Encounter (Signed)
Dr. Sheppard Coil sent Sadexa to the pharmacy for patient. Transmission failed, Rx didn't make it to the pharmacy on 09/22/19.

## 2019-10-04 ENCOUNTER — Other Ambulatory Visit: Payer: Self-pay | Admitting: Osteopathic Medicine

## 2019-10-04 MED ORDER — SAXENDA 18 MG/3ML ~~LOC~~ SOPN: 3.0000 mg | PEN_INJECTOR | Freq: Every day | SUBCUTANEOUS | 0 refills | Status: DC

## 2019-10-04 NOTE — Telephone Encounter (Signed)
Please advise. The  saxenda  RX didn't make it to the pharmacy. Please resend the patient is looking it.   Thanks

## 2019-10-05 ENCOUNTER — Other Ambulatory Visit: Payer: Self-pay

## 2019-10-05 MED ORDER — SAXENDA 18 MG/3ML ~~LOC~~ SOPN: 3.0000 mg | PEN_INJECTOR | Freq: Every day | SUBCUTANEOUS | 0 refills | Status: DC

## 2019-10-10 NOTE — Telephone Encounter (Addendum)
FYI - sent to coordinator to obtain PA when scheduled.

## 2019-10-11 ENCOUNTER — Telehealth: Payer: Self-pay | Admitting: Osteopathic Medicine

## 2019-10-11 NOTE — Telephone Encounter (Signed)
Received fax for PA on Saxenda sent through cover my meds waiting on determination. - CF 

## 2019-10-16 NOTE — Telephone Encounter (Signed)
Received fax from [place] and they approved coverage on Saxenda   Reference [id number] Valid 10/13/19 - 02/12/20 - CF

## 2019-11-11 ENCOUNTER — Other Ambulatory Visit: Payer: Self-pay | Admitting: Osteopathic Medicine

## 2019-11-11 DIAGNOSIS — R635 Abnormal weight gain: Secondary | ICD-10-CM

## 2019-11-13 NOTE — Telephone Encounter (Signed)
Last refill 10/05/2019 Last OV-09/21/2019

## 2019-11-21 NOTE — Telephone Encounter (Signed)
Did PA for Saxenda and received approval - CF

## 2020-02-12 ENCOUNTER — Telehealth: Payer: Self-pay | Admitting: *Deleted

## 2020-02-12 NOTE — Telephone Encounter (Signed)
PA approved for Saxenda. Authorization is effective for a maximum of 12 fills from 02/12/2020 to 02/10/2021, as long as the member is enrolled in their current health plan. This has been approved for a quantity limit of 15.0 with a day supply limit of 30.0.

## 2020-02-12 NOTE — Telephone Encounter (Signed)
Received PA request to continue Saxenda from [place] [facility]. Awaiting response.

## 2020-02-21 ENCOUNTER — Encounter: Payer: Self-pay | Admitting: Osteopathic Medicine

## 2020-02-21 ENCOUNTER — Other Ambulatory Visit: Payer: Self-pay | Admitting: Obstetrics & Gynecology

## 2020-02-21 MED ORDER — AMOXICILLIN-POT CLAVULANATE 875-125 MG PO TABS
1.0000 | ORAL_TABLET | Freq: Two times a day (BID) | ORAL | 0 refills | Status: DC
Start: 2020-02-21 — End: 2021-01-31

## 2020-02-21 NOTE — Progress Notes (Signed)
Barbara Scott has been having rhinorrhea and sinus pressure for 7 days.  NO fever.  Covid negative.  Pressure is worsening. Will treat with Augmentin for 7 days.

## 2020-09-25 ENCOUNTER — Encounter: Payer: No Typology Code available for payment source | Admitting: Osteopathic Medicine

## 2021-01-30 ENCOUNTER — Other Ambulatory Visit: Payer: Self-pay

## 2021-01-30 ENCOUNTER — Emergency Department (HOSPITAL_COMMUNITY)
Admission: EM | Admit: 2021-01-30 | Discharge: 2021-01-31 | Disposition: A | Discharge status: Home / Self Care | Payer: No Typology Code available for payment source | Attending: Emergency Medicine | Admitting: Emergency Medicine

## 2021-01-30 ENCOUNTER — Encounter (HOSPITAL_COMMUNITY): Payer: Self-pay

## 2021-01-30 DIAGNOSIS — R109 Unspecified abdominal pain: Secondary | ICD-10-CM | POA: Diagnosis present

## 2021-01-30 DIAGNOSIS — R102 Pelvic and perineal pain: Secondary | ICD-10-CM | POA: Diagnosis not present

## 2021-01-30 LAB — CBC WITH DIFFERENTIAL/PLATELET
Abs Immature Granulocytes: 0.04 10*3/uL (ref 0.00–0.07)
Basophils Absolute: 0.1 10*3/uL (ref 0.0–0.1)
Basophils Relative: 0 %
Eosinophils Absolute: 0.1 10*3/uL (ref 0.0–0.5)
Eosinophils Relative: 1 %
HCT: 35 % — ABNORMAL LOW (ref 36.0–46.0)
Hemoglobin: 11.8 g/dL — ABNORMAL LOW (ref 12.0–15.0)
Immature Granulocytes: 0 %
Lymphocytes Relative: 37 %
Lymphs Abs: 4.6 10*3/uL — ABNORMAL HIGH (ref 0.7–4.0)
MCH: 28.7 pg (ref 26.0–34.0)
MCHC: 33.7 g/dL (ref 30.0–36.0)
MCV: 85.2 fL (ref 80.0–100.0)
Monocytes Absolute: 1 10*3/uL (ref 0.1–1.0)
Monocytes Relative: 8 %
Neutro Abs: 6.6 10*3/uL (ref 1.7–7.7)
Neutrophils Relative %: 54 %
Platelets: 355 10*3/uL (ref 150–400)
RBC: 4.11 MIL/uL (ref 3.87–5.11)
RDW: 13.5 % (ref 11.5–15.5)
WBC: 12.4 10*3/uL — ABNORMAL HIGH (ref 4.0–10.5)
nRBC: 0 % (ref 0.0–0.2)

## 2021-01-30 MED ORDER — HYDROMORPHONE HCL 1 MG/ML IJ SOLN
1.0000 mg | Freq: Once | INTRAMUSCULAR | Status: AC
  Administered 2021-01-30: 1 mg via INTRAVENOUS
  Filled 2021-01-30: qty 1

## 2021-01-30 MED ORDER — ONDANSETRON HCL 4 MG/2ML IJ SOLN
4.0000 mg | Freq: Once | INTRAMUSCULAR | Status: AC
  Administered 2021-01-30: 4 mg via INTRAVENOUS
  Filled 2021-01-30: qty 2

## 2021-01-30 NOTE — ED Provider Notes (Signed)
[facility] EMERGENCY DEPARTMENT Provider Note   CSN: [id number] Arrival date & time: 01/30/21  2305     History Chief Complaint  Patient presents with   Abdominal Pain    Barbara Scott is a 35 y.o. female.  Patient presents to the emergency department for evaluation of abdominal pain.  Patient reports that she had sudden onset of an urge to urinate and then felt uncontrollable, severe bladder pain ever since.  No associated nausea or vomiting.      Past Medical History:  Diagnosis Date   Anemia    with pregnancy only - resolved   Family history of breast cancer    Family history of melanoma    Family history of prostate cancer    Headache(784.0)    migraines - otc med   Pregnancy induced hypertension    with pregnancy only - resolved   Skin lesions 01/2014   chest, right axilla, abd., back   SVD (spontaneous vaginal delivery)    x 1    Patient Active Problem List   Diagnosis Date Noted   Abnormal weight gain 09/21/2019   Family history of prostate cancer    Family history of melanoma    Gestational hypertension 01/28/2015    Past Surgical History:  Procedure Laterality Date   ABDOMINAL HYSTERECTOMY     CESAREAN SECTION WITH BILATERAL TUBAL LIGATION Bilateral 02/05/2015   Procedure: CESAREAN SECTION WITH BILATERAL TUBAL LIGATION;  Surgeon: Molli Posey, MD;  Location: [facility];  Service: Obstetrics;  Laterality: Bilateral;   CHOLECYSTECTOMY     CHOLESTEATOMA EXCISION     DILATION AND EVACUATION N/A 01/01/2014   Procedure: DILATATION AND EVACUATION;  Surgeon: Margarette Asal, MD;  Location: [facility];  Service: Gynecology;  Laterality: N/A;   EYE SURGERY Bilateral    lasik   KNEE ARTHROSCOPY Right    LAPAROSCOPIC ASSISTED VAGINAL HYSTERECTOMY N/A 01/25/2017   Procedure: LAPAROSCOPIC ASSISTED VAGINAL HYSTERECTOMY WITH LEFT OOPHERECTOMY;  Surgeon: Molli Posey, MD;  Location: [facility];  Service: Gynecology;  Laterality: N/A;  request Dortha Kern   LESION  REMOVAL N/A 02/02/2014   Procedure: MINOR EXICISION OF LESION X3  RIGHT AXILLA, CHEST AND ABDOMEN;  Surgeon: Alphonsa Overall, MD;  Location: [facility];  Service: General;  Laterality: N/A;   MELANOMA EXCISION     patient denies this procedure   NEVUS EXCISION  08/07/2004   multiple   TONSILLECTOMY     TUBAL LIGATION       OB History     Gravida  3   Para  2   Term  1   Preterm  1   AB  1   Living  2      SAB  1   IAB      Ectopic      Multiple  0   Live Births  2           Family History  Problem Relation Age of Onset   Hypertension Mother    Thyroid disease Mother    Melanoma Mother    Cancer Father    Hypertension Maternal Uncle    Heart disease Maternal Uncle    Lung cancer Maternal Grandfather    Prostate cancer Maternal Grandfather    Prostate cancer Paternal Grandfather     Social History   Tobacco Use   Smoking status: Never   Smokeless tobacco: Never  Vaping Use   Vaping Use: Never used  Substance Use Topics  Alcohol use: Yes    Comment: occasional   Drug use: No    Home Medications Prior to Admission medications   Medication Sig Start Date End Date Taking? Authorizing Provider  cephALEXin (KEFLEX) 500 MG capsule Take 1 capsule (500 mg total) by mouth 2 (two) times daily. 01/31/21  Yes Savier Trickett, Gwenyth Allegra, MD  EPINEPHrine (EPIPEN JR) 0.15 MG/0.3ML injection Inject 0.15 mg into the muscle as needed for anaphylaxis.  Patient not taking: Reported on 09/21/2019    [provider]  SAXENDA 18 MG/3ML SOPN INJECT 0.6MG  DAILY FOR 1 WEEK, THEN INCREASE BY 0.6MG  WEEKLY UNTIL REACHING 3MG  DAILY 11/13/19   Emeterio Reeve, DO    Allergies    Hydrocodone-acetaminophen, Bee venom, Shrimp [shellfish allergy], Betadine [povidone iodine], Other, and Vicodin [hydrocodone-acetaminophen]  Review of Systems   Review of Systems  Gastrointestinal:  Positive for abdominal pain.  All other systems reviewed and are  negative.  Physical Exam Updated Vital Signs BP (!) 105/59   Pulse 68   Temp 98 F (36.7 C) (Oral)   Resp 11   Ht 5\' 5"  (1.651 m)   Wt 93 kg   LMP 01/20/2017 (Exact Date)   SpO2 95%   BMI 34.11 kg/m   Physical Exam Vitals and nursing note reviewed.  Constitutional:      General: She is not in acute distress.    Appearance: Normal appearance. She is well-developed.  HENT:     Head: Normocephalic and atraumatic.     Right Ear: Hearing normal.     Left Ear: Hearing normal.     Nose: Nose normal.  Eyes:     Conjunctiva/sclera: Conjunctivae normal.     Pupils: Pupils are equal, round, and reactive to light.  Cardiovascular:     Rate and Rhythm: Regular rhythm.     Heart sounds: S1 normal and S2 normal. No murmur heard.   No friction rub. No gallop.  Pulmonary:     Effort: Pulmonary effort is normal. No respiratory distress.     Breath sounds: Normal breath sounds.  Chest:     Chest wall: No tenderness.  Abdominal:     General: Bowel sounds are normal.     Palpations: Abdomen is soft.     Tenderness: There is abdominal tenderness in the suprapubic area. There is no guarding or rebound. Negative signs include Murphy's sign and McBurney's sign.     Hernia: No hernia is present.  Musculoskeletal:        General: Normal range of motion.     Cervical back: Normal range of motion and neck supple.  Skin:    General: Skin is warm and dry.     Findings: No rash.  Neurological:     Mental Status: She is alert and oriented to person, place, and time.     GCS: GCS eye subscore is 4. GCS verbal subscore is 5. GCS motor subscore is 6.     Cranial Nerves: No cranial nerve deficit.     Sensory: No sensory deficit.     Coordination: Coordination normal.  Psychiatric:        Speech: Speech normal.        Behavior: Behavior normal.        Thought Content: Thought content normal.    ED Results / Procedures / Treatments   Labs (all labs ordered are listed, but only abnormal  results are displayed) Labs Reviewed  CBC WITH DIFFERENTIAL/PLATELET - Abnormal; Notable for the following components:  Result Value   WBC 12.4 (*)    Hemoglobin 11.8 (*)    HCT 35.0 (*)    Lymphs Abs 4.6 (*)    All other components within normal limits  BASIC METABOLIC PANEL - Abnormal; Notable for the following components:   Potassium 3.3 (*)    Glucose, Bld 105 (*)    Creatinine, Ser 1.02 (*)    All other components within normal limits  URINALYSIS, ROUTINE W REFLEX MICROSCOPIC - Abnormal; Notable for the following components:   APPearance HAZY (*)    Hgb urine dipstick LARGE (*)    Protein, ur 30 (*)    RBC / HPF >50 (*)    Bacteria, UA RARE (*)    All other components within normal limits    EKG None  Radiology CT Renal Stone Study  Result Date: 01/31/2021 CLINICAL DATA:  Lower abdominal pain. EXAM: CT ABDOMEN AND PELVIS WITHOUT CONTRAST TECHNIQUE: Multidetector CT imaging of the abdomen and pelvis was performed following the standard protocol without IV contrast. COMPARISON:  None. FINDINGS: Lower chest: No acute abnormality. Hepatobiliary: No focal liver abnormality is seen. Status post cholecystectomy. No biliary dilatation. Pancreas: Unremarkable. No pancreatic ductal dilatation or surrounding inflammatory changes. Spleen: Normal in size without focal abnormality. Adrenals/Urinary Tract: Adrenal glands are unremarkable. Kidneys are normal, without renal calculi, focal lesion, or hydronephrosis. Bladder is unremarkable. Stomach/Bowel: Stomach is within normal limits. Appendix appears normal. No evidence of bowel wall thickening, distention, or inflammatory changes. Vascular/Lymphatic: No significant vascular findings are present. No enlarged abdominal or pelvic lymph nodes. Reproductive: Status post hysterectomy. No adnexal masses. Other: No abdominal wall hernia or abnormality. No abdominopelvic ascites. Musculoskeletal: No acute or significant osseous findings. IMPRESSION:  1. No acute intra-abdominal findings. 2. Status post cholecystectomy and hysterectomy. Electronically Signed   By: Virgina Norfolk M.D.   On: 01/31/2021 02:06    Procedures Procedures   Medications Ordered in ED Medications  cephALEXin (KEFLEX) capsule 500 mg (has no administration in time range)  HYDROmorphone (DILAUDID) injection 1 mg (1 mg Intravenous Given 01/30/21 2333)  ondansetron (ZOFRAN) injection 4 mg (4 mg Intravenous Given 01/30/21 2334)  sodium chloride 0.9 % bolus 500 mL (0 mLs Intravenous Stopped 01/31/21 0218)    ED Course  I have reviewed the triage vital signs and the nursing notes.  Pertinent labs & imaging results that were available during my care of the patient were reviewed by me and considered in my medical decision making (see chart for details).    MDM Rules/Calculators/A&P                           Patient presents with severe abdominal pain.  Patient reports that pain began somewhat suddenly around 9 PM.  She reports that she felt urgency to urinate and then has been having severe pain since.  Patient does report a history of recurrent UTIs in the past.  She also reports that she has a known renal stone.  Patient has had previous hysterectomy, only has a right ovary.  No right adnexal area pain.  Urinalysis with red blood cells, otherwise no clear infection.  CT scan without abnormality.  Will empirically treat with antibiotics as she does have a history of recurrent UTI and symptomatology sounds suspicious for UTI.  Culture urine.  Schedule follow-up with PCP.  Return for worsening symptoms.  Final Clinical Impression(s) / ED Diagnoses Final diagnoses:  Pelvic pain in female    Rx /  DC Orders ED Discharge Orders          Ordered    cephALEXin (KEFLEX) 500 MG capsule  2 times daily        01/31/21 0228             Orpah Greek, MD 01/31/21 [id number]

## 2021-01-30 NOTE — ED Triage Notes (Signed)
Pt c/o lower abdominal pain that started around 9pm tonight that radiates to her back. Denies painful urination

## 2021-01-31 ENCOUNTER — Emergency Department (HOSPITAL_COMMUNITY): Payer: No Typology Code available for payment source

## 2021-01-31 LAB — URINALYSIS, ROUTINE W REFLEX MICROSCOPIC
Bilirubin Urine: NEGATIVE
Glucose, UA: NEGATIVE mg/dL
Ketones, ur: NEGATIVE mg/dL
Leukocytes,Ua: NEGATIVE
Nitrite: NEGATIVE
Protein, ur: 30 mg/dL — AB
RBC / HPF: >50 RBC/hpf — ABNORMAL HIGH (ref 0–5)
Specific Gravity, Urine: 1.021 (ref 1.005–1.030)
pH: 6 (ref 5.0–8.0)

## 2021-01-31 LAB — BASIC METABOLIC PANEL
Anion gap: 10 (ref 5–15)
BUN: 19 mg/dL (ref 6–20)
CO2: 24 mmol/L (ref 22–32)
Calcium: 9 mg/dL (ref 8.9–10.3)
Chloride: 104 mmol/L (ref 98–111)
Creatinine, Ser: 1.02 mg/dL — ABNORMAL HIGH (ref 0.44–1.00)
GFR, Estimated: >60 mL/min (ref >60)
Glucose, Bld: 105 mg/dL — ABNORMAL HIGH (ref 70–99)
Potassium: 3.3 mmol/L — ABNORMAL LOW (ref 3.5–5.1)
Sodium: 138 mmol/L (ref 135–145)

## 2021-01-31 MED ORDER — SODIUM CHLORIDE 0.9 % IV BOLUS
500.0000 mL | Freq: Once | INTRAVENOUS | Status: AC
  Administered 2021-01-31: 500 mL via INTRAVENOUS

## 2021-01-31 MED ORDER — CEPHALEXIN 500 MG PO CAPS
500.0000 mg | ORAL_CAPSULE | Freq: Once | ORAL | Status: AC
  Administered 2021-01-31: 500 mg via ORAL
  Filled 2021-01-31: qty 1

## 2021-01-31 MED ORDER — CEPHALEXIN 500 MG PO CAPS: 500.0000 mg | ORAL_CAPSULE | Freq: Two times a day (BID) | ORAL | 0 refills | Status: DC

## 2021-01-31 NOTE — ED Notes (Signed)
Pt ambulated to restroom. 

## 2021-01-31 NOTE — ED Notes (Signed)
Patient transported to CT 

## 2021-02-01 LAB — URINE CULTURE: Culture: 50000 — AB

## 2021-02-02 ENCOUNTER — Telehealth: Payer: Self-pay | Admitting: Emergency Medicine

## 2021-02-02 NOTE — Telephone Encounter (Signed)
Post ED Visit - Positive Culture Follow-up  Culture report reviewed by antimicrobial stewardship pharmacist: [place] Team []  Elenor Quinones, Pharm.D. []  Heide Guile, Pharm.D., BCPS AQ-ID []  Parks Neptune, Pharm.D., BCPS []  Alycia Rossetti, Pharm.D., BCPS []  [place], Pharm.D., BCPS, AAHIVP []  Legrand Como, Pharm.D., BCPS, AAHIVP []  Salome Arnt, PharmD, BCPS []  Johnnette Gourd, PharmD, BCPS []  Hughes Better, PharmD, BCPS [x]  Joetta Manners PharmD []  Laqueta Linden, PharmD, BCPS []  Albertina Parr, PharmD  [place] Team []  Leodis Sias, PharmD []  Lindell Spar, PharmD []  Royetta Asal, PharmD []  Graylin Shiver, Rph []  Rema Fendt) Glennon Mac, PharmD []  Arlyn Dunning, PharmD []  Netta Cedars, PharmD []  Dia Sitter, PharmD []  Leone Haven, PharmD []  Gretta Arab, PharmD []  Theodis Shove, PharmD []  Peggyann Juba, PharmD []  Reuel Boom, PharmD   Positive urine culture Treated with Cephalexin, organism sensitive to the same and no further patient follow-up is required at this time.  Barbara Scott 02/02/2021, 5:34 PM

## 2021-02-24 ENCOUNTER — Other Ambulatory Visit: Payer: Self-pay

## 2021-02-24 ENCOUNTER — Encounter (HOSPITAL_BASED_OUTPATIENT_CLINIC_OR_DEPARTMENT_OTHER): Payer: Self-pay | Admitting: Obstetrics and Gynecology

## 2021-02-24 NOTE — H&P (Signed)
NAME: Barbara Scott, Barnick MEDICAL RECORD NO: [id number] ACCOUNT NO: [id number] DATE OF BIRTH: 04-20-1985 PHYSICIAN: Ralene Bathe. Matthew Saras, MD  History and Physical   DATE OF ADMISSION: 03/03/2021  Surgery scheduled at [facility] on 11/28.  CHIEF COMPLAINT:  Pelvic pressure, bloating.  HISTORY OF PRESENT ILLNESS:  A 35 year old G3, P2 in 2018 she had a LAVH LSO, mild cul-de-sac endometriosis was noted at that time, although was negative on pathology.  More recently, she had a sudden increase pelvic pressure actually ended up in the ED, CT was done that was negative.  She was thought to perhaps have a UTI, was treated empirically and followed up at [facility] who placed her on Myrbetriq.  This  has not helped substantially.  Ultrasound recently in our office was negative.  She is frustrated because this is particularly bothersome and does not appear to have significant pelvic relaxation.  She is concerned about some potential recurrence of endometriosis.  We also discussed  the possibility of right adnexal adhesions or intestinal adhesions, perhaps as a cause of some of her discomfort.  Specifically, discussed laparoscopy with possible RSO, she understands that this may need to be accomplished by open technique.  This procedure including risks regarding bleeding, infection, wound infection, phlebitis, adjacent organ injury, possible  need for ERT if her right ovary is removed.  All discussed with her what she understands and accepts.  PAST MEDICAL HISTORY: ALLERGIES:  None.  MEDICATIONS:  HCTZ and Mounjaro.  FAMILY HISTORY:  Significant for family history of hypertension, mother and father.  Her mother had melanoma and dyslipidemia.  PHYSICAL EXAMINATION:   VITAL SIGNS:  Temperature 98.2, blood pressure 130/78, weight 204. HEENT:  Unremarkable. NECK:  Supple, without masses. LUNGS:  Clear. CARDIOVASCULAR:  Regular rate and rhythm without murmurs, rubs or gallops. BREASTS:  Without  masses. ABDOMEN:  Soft, flat, nontender. PELVIC:  Vulva, vagina, vaginal cuff normal.  On straining I could not detect significant cystocele or rectocele.  No unusual nodularity at the cuff, no masses noted, but she was slightly tender in the midline. EXTREMITIES/NEUROLOGIC:  Unremarkable.  IMPRESSION:  Pelvic pain/pressure, history of mild endometriosis as noted above.  Recent CT was negative as noted.  We will proceed with diagnostic laparoscopy, possible RSO/laparotomy.  Procedure and risks discussed as above.   PUS D: 02/24/2021 11:27:19 am T: 02/24/2021 3:09:00 pm  JOB: [id number]/ [id number]

## 2021-02-24 NOTE — Progress Notes (Signed)
Spoke w/ via phone for pre-op interview--- pt Lab needs dos----   [employer] and ekg (per anes);  pre-op orders pending            Lab results------ no COVID test -----patient states asymptomatic no test needed Arrive at ------- 0530 on 03-03-2021 NPO after MN NO Solid Food.  Clear liquids from MN until--- 0430 Med rec completed Medications to take morning of surgery ----- none Diabetic medication ----- n/a Patient instructed no nail polish to be worn day of surgery Patient instructed to bring photo id and insurance card day of surgery Patient aware to have Driver (ride ) / caregiver for 24 hours after surgery --husband, brock Patient Special Instructions ----- n/a Pre-Op special Istructions ----- sent inbox message to dr Matthew Saras in epic requested orders Patient verbalized understanding of instructions that were given at this phone interview. Patient denies shortness of breath, chest pain, fever, cough at this phone interview.

## 2021-03-02 NOTE — Anesthesia Preprocedure Evaluation (Addendum)
Anesthesia Evaluation  Patient identified by MRN, date of birth, ID band Patient awake    Reviewed: Allergy & Precautions, NPO status , Patient's Chart, lab work & pertinent test results  Airway Mallampati: III  TM Distance: >3 FB Neck ROM: Full    Dental  (+) Teeth Intact, Dental Advisory Given   Pulmonary neg pulmonary ROS,    Pulmonary exam normal breath sounds clear to auscultation       Cardiovascular hypertension, Pt. on medications Normal cardiovascular exam Rhythm:Regular Rate:Normal     Neuro/Psych  Headaches, negative psych ROS   GI/Hepatic negative GI ROS, Neg liver ROS,   Endo/Other  negative endocrine ROS  Renal/GU negative Renal ROS  Female GU complaint     Musculoskeletal negative musculoskeletal ROS (+)   Abdominal   Peds  Hematology negative hematology ROS (+)   Anesthesia Other Findings   Reproductive/Obstetrics negative OB ROS                            Anesthesia Physical Anesthesia Plan  ASA: 2  Anesthesia Plan: General   Post-op Pain Management: Ketamine IV, Ofirmev IV (intra-op) and Toradol IV (intra-op)   Induction: Intravenous  PONV Risk Score and Plan: 4 or greater and Ondansetron, Aprepitant, Dexamethasone, Midazolam, Scopolamine patch - Pre-op, Treatment may vary due to age or medical condition, Propofol infusion, TIVA, Diphenhydramine and Metaclopromide  Airway Management Planned: Oral ETT  Additional Equipment: None  Intra-op Plan:   Post-operative Plan: Extubation in OR  Informed Consent: I have reviewed the patients History and Physical, chart, labs and discussed the procedure including the risks, benefits and alternatives for the proposed anesthesia with the patient or authorized representative who has indicated his/her understanding and acceptance.     Dental advisory given  Plan Discussed with: CRNA  Anesthesia Plan Comments:         Anesthesia Quick Evaluation

## 2021-03-03 ENCOUNTER — Observation Stay (HOSPITAL_BASED_OUTPATIENT_CLINIC_OR_DEPARTMENT_OTHER): Payer: No Typology Code available for payment source | Admitting: Anesthesiology

## 2021-03-03 ENCOUNTER — Other Ambulatory Visit: Payer: Self-pay

## 2021-03-03 ENCOUNTER — Observation Stay (HOSPITAL_BASED_OUTPATIENT_CLINIC_OR_DEPARTMENT_OTHER)
Admission: RE | Admit: 2021-03-03 | Discharge: 2021-03-03 | Disposition: A | Discharge status: Home / Self Care | Payer: No Typology Code available for payment source | Attending: Obstetrics and Gynecology | Admitting: Obstetrics and Gynecology

## 2021-03-03 ENCOUNTER — Encounter (HOSPITAL_BASED_OUTPATIENT_CLINIC_OR_DEPARTMENT_OTHER): Payer: Self-pay | Admitting: Obstetrics and Gynecology

## 2021-03-03 ENCOUNTER — Encounter (HOSPITAL_BASED_OUTPATIENT_CLINIC_OR_DEPARTMENT_OTHER)
Admission: RE | Disposition: A | Discharge status: Home / Self Care | Payer: Self-pay | Source: Home / Self Care | Attending: Obstetrics and Gynecology

## 2021-03-03 DIAGNOSIS — R102 Pelvic and perineal pain: Secondary | ICD-10-CM | POA: Diagnosis present

## 2021-03-03 DIAGNOSIS — Z8742 Personal history of other diseases of the female genital tract: Secondary | ICD-10-CM | POA: Diagnosis not present

## 2021-03-03 DIAGNOSIS — N736 Female pelvic peritoneal adhesions (postinfective): Secondary | ICD-10-CM | POA: Diagnosis not present

## 2021-03-03 HISTORY — DX: Presence of spectacles and contact lenses: Z97.3

## 2021-03-03 HISTORY — DX: Migraine, unspecified, not intractable, without status migrainosus: G43.909

## 2021-03-03 HISTORY — DX: Pelvic and perineal pain unspecified side: R10.20

## 2021-03-03 HISTORY — PX: LAPAROSCOPY: SHX197

## 2021-03-03 HISTORY — DX: Pelvic and perineal pain: R10.2

## 2021-03-03 HISTORY — DX: Personal history of diseases of the blood and blood-forming organs and certain disorders involving the immune mechanism: Z86.2

## 2021-03-03 HISTORY — DX: Essential (primary) hypertension: I10

## 2021-03-03 HISTORY — DX: Personal history of other complications of pregnancy, childbirth and the puerperium: Z87.59

## 2021-03-03 LAB — POCT I-STAT, CHEM 8
BUN: 13 mg/dL (ref 6–20)
Calcium, Ion: 1.25 mmol/L (ref 1.15–1.40)
Chloride: 100 mmol/L (ref 98–111)
Creatinine, Ser: 0.8 mg/dL (ref 0.44–1.00)
Glucose, Bld: 93 mg/dL (ref 70–99)
HCT: 38 % (ref 36.0–46.0)
Hemoglobin: 12.9 g/dL (ref 12.0–15.0)
Potassium: 3.4 mmol/L — ABNORMAL LOW (ref 3.5–5.1)
Sodium: 141 mmol/L (ref 135–145)
TCO2: 27 mmol/L (ref 22–32)

## 2021-03-03 LAB — POCT PREGNANCY, URINE: Preg Test, Ur: NEGATIVE

## 2021-03-03 LAB — TYPE AND SCREEN
ABO/RH(D): A POS
Antibody Screen: NEGATIVE

## 2021-03-03 SURGERY — LAPAROSCOPY, DIAGNOSTIC
Anesthesia: General

## 2021-03-03 MED ORDER — OXYCODONE HCL 5 MG PO TABS: 5.0000 mg | ORAL_TABLET | Freq: Once | ORAL | Status: DC | PRN

## 2021-03-03 MED ORDER — PROMETHAZINE HCL 25 MG/ML IJ SOLN: 6.2500 mg | INTRAMUSCULAR | Status: DC | PRN

## 2021-03-03 MED ORDER — DEXAMETHASONE SODIUM PHOSPHATE 10 MG/ML IJ SOLN
INTRAMUSCULAR | Status: AC
  Filled 2021-03-03: qty 1

## 2021-03-03 MED ORDER — ACETAMINOPHEN 10 MG/ML IV SOLN
INTRAVENOUS | Status: AC
  Filled 2021-03-03: qty 100

## 2021-03-03 MED ORDER — MIDAZOLAM HCL 5 MG/5ML IJ SOLN
INTRAMUSCULAR | Status: DC | PRN
  Administered 2021-03-03 (×2): 2 mg via INTRAVENOUS

## 2021-03-03 MED ORDER — FENTANYL CITRATE (PF) 100 MCG/2ML IJ SOLN
INTRAMUSCULAR | Status: AC
  Filled 2021-03-03: qty 2

## 2021-03-03 MED ORDER — LIDOCAINE 2% (20 MG/ML) 5 ML SYRINGE
INTRAMUSCULAR | Status: AC
  Filled 2021-03-03: qty 5

## 2021-03-03 MED ORDER — KETOROLAC TROMETHAMINE 30 MG/ML IJ SOLN: 30.0000 mg | Freq: Once | INTRAMUSCULAR | Status: DC | PRN

## 2021-03-03 MED ORDER — KETAMINE HCL 50 MG/5ML IJ SOSY
PREFILLED_SYRINGE | INTRAMUSCULAR | Status: AC
  Filled 2021-03-03: qty 5

## 2021-03-03 MED ORDER — DEXAMETHASONE SODIUM PHOSPHATE 4 MG/ML IJ SOLN
INTRAMUSCULAR | Status: DC | PRN
  Administered 2021-03-03: 10 mg via INTRAVENOUS

## 2021-03-03 MED ORDER — APREPITANT 40 MG PO CAPS
ORAL_CAPSULE | ORAL | Status: AC
  Filled 2021-03-03: qty 1

## 2021-03-03 MED ORDER — SODIUM CHLORIDE 0.9 % IV SOLN
INTRAVENOUS | Status: AC
  Filled 2021-03-03: qty 2

## 2021-03-03 MED ORDER — DEXMEDETOMIDINE (PRECEDEX) IN NS 20 MCG/5ML (4 MCG/ML) IV SYRINGE
PREFILLED_SYRINGE | INTRAVENOUS | Status: AC
  Filled 2021-03-03: qty 5

## 2021-03-03 MED ORDER — AMISULPRIDE (ANTIEMETIC) 5 MG/2ML IV SOLN: 10.0000 mg | Freq: Once | INTRAVENOUS | Status: DC | PRN

## 2021-03-03 MED ORDER — FENTANYL CITRATE (PF) 100 MCG/2ML IJ SOLN
INTRAMUSCULAR | Status: DC | PRN
  Administered 2021-03-03 (×2): 100 ug via INTRAVENOUS

## 2021-03-03 MED ORDER — MIDAZOLAM HCL 2 MG/2ML IJ SOLN
INTRAMUSCULAR | Status: AC
  Filled 2021-03-03: qty 2

## 2021-03-03 MED ORDER — SUGAMMADEX SODIUM 200 MG/2ML IV SOLN
INTRAVENOUS | Status: DC | PRN
  Administered 2021-03-03: 200 mg via INTRAVENOUS

## 2021-03-03 MED ORDER — HYDROMORPHONE HCL 1 MG/ML IJ SOLN: 0.2500 mg | INTRAMUSCULAR | Status: DC | PRN

## 2021-03-03 MED ORDER — ONDANSETRON HCL 4 MG/2ML IJ SOLN
INTRAMUSCULAR | Status: DC | PRN
  Administered 2021-03-03: 4 mg via INTRAVENOUS

## 2021-03-03 MED ORDER — LIDOCAINE 2% (20 MG/ML) 5 ML SYRINGE
INTRAMUSCULAR | Status: DC | PRN
  Administered 2021-03-03: 60 mg via INTRAVENOUS

## 2021-03-03 MED ORDER — APREPITANT 40 MG PO CAPS
40.0000 mg | ORAL_CAPSULE | Freq: Once | ORAL | Status: AC
  Administered 2021-03-03: 07:00:00 40 mg via ORAL

## 2021-03-03 MED ORDER — PROPOFOL 500 MG/50ML IV EMUL
INTRAVENOUS | Status: DC | PRN
  Administered 2021-03-03: 150 ug/kg/min via INTRAVENOUS

## 2021-03-03 MED ORDER — LACTATED RINGERS IV SOLN: INTRAVENOUS | Status: DC

## 2021-03-03 MED ORDER — OXYCODONE HCL 5 MG/5ML PO SOLN: 5.0000 mg | Freq: Once | ORAL | Status: DC | PRN

## 2021-03-03 MED ORDER — SCOPOLAMINE 1 MG/3DAYS TD PT72
1.0000 | MEDICATED_PATCH | TRANSDERMAL | Status: DC
  Administered 2021-03-03: 07:00:00 1.5 mg via TRANSDERMAL

## 2021-03-03 MED ORDER — PROPOFOL 10 MG/ML IV BOLUS
INTRAVENOUS | Status: DC | PRN
  Administered 2021-03-03: 200 mg via INTRAVENOUS

## 2021-03-03 MED ORDER — DEXMEDETOMIDINE (PRECEDEX) IN NS 20 MCG/5ML (4 MCG/ML) IV SYRINGE
PREFILLED_SYRINGE | INTRAVENOUS | Status: DC | PRN
  Administered 2021-03-03: 12 ug via INTRAVENOUS

## 2021-03-03 MED ORDER — SODIUM CHLORIDE 0.9 % IV SOLN
2.0000 g | INTRAVENOUS | Status: AC
  Administered 2021-03-03: 08:00:00 2 g via INTRAVENOUS

## 2021-03-03 MED ORDER — BUPIVACAINE HCL (PF) 0.25 % IJ SOLN
INTRAMUSCULAR | Status: DC | PRN
  Administered 2021-03-03: 10 mL

## 2021-03-03 MED ORDER — IBUPROFEN 200 MG PO TABS: 600.0000 mg | ORAL_TABLET | Freq: Three times a day (TID) | ORAL | Status: DC | PRN

## 2021-03-03 MED ORDER — HYDROCHLOROTHIAZIDE 25 MG PO TABS: 25.0000 mg | ORAL_TABLET | Freq: Every day | ORAL | Status: DC

## 2021-03-03 MED ORDER — KETOROLAC TROMETHAMINE 30 MG/ML IJ SOLN
INTRAMUSCULAR | Status: DC | PRN
  Administered 2021-03-03: 30 mg via INTRAVENOUS

## 2021-03-03 MED ORDER — MEPERIDINE HCL 25 MG/ML IJ SOLN: 6.2500 mg | INTRAMUSCULAR | Status: DC | PRN

## 2021-03-03 MED ORDER — ONDANSETRON HCL 4 MG/2ML IJ SOLN
INTRAMUSCULAR | Status: AC
  Filled 2021-03-03: qty 2

## 2021-03-03 MED ORDER — ASPIRIN-ACETAMINOPHEN-CAFFEINE 250-250-65 MG PO TABS: 1.0000 | ORAL_TABLET | Freq: Four times a day (QID) | ORAL | Status: DC | PRN

## 2021-03-03 MED ORDER — POVIDONE-IODINE 10 % EX SWAB: 2.0000 "application " | Freq: Once | CUTANEOUS | Status: DC

## 2021-03-03 MED ORDER — ROCURONIUM BROMIDE 10 MG/ML (PF) SYRINGE
PREFILLED_SYRINGE | INTRAVENOUS | Status: AC
  Filled 2021-03-03: qty 10

## 2021-03-03 MED ORDER — ACETAMINOPHEN 10 MG/ML IV SOLN
1000.0000 mg | Freq: Once | INTRAVENOUS | Status: AC
  Administered 2021-03-03: 08:00:00 1000 mg via INTRAVENOUS

## 2021-03-03 MED ORDER — ROCURONIUM BROMIDE 10 MG/ML (PF) SYRINGE
PREFILLED_SYRINGE | INTRAVENOUS | Status: DC | PRN
  Administered 2021-03-03: 60 mg via INTRAVENOUS

## 2021-03-03 MED ORDER — SCOPOLAMINE 1 MG/3DAYS TD PT72
MEDICATED_PATCH | TRANSDERMAL | Status: AC
  Filled 2021-03-03: qty 1

## 2021-03-03 MED ORDER — KETAMINE HCL 10 MG/ML IJ SOLN
INTRAMUSCULAR | Status: DC | PRN
  Administered 2021-03-03: 50 mg via INTRAVENOUS

## 2021-03-03 SURGICAL SUPPLY — 84 items
ADH SKN CLS APL DERMABOND .7 (GAUZE/BANDAGES/DRESSINGS) ×2
APL PRP STRL LF DISP 70% ISPRP (MISCELLANEOUS) ×2
APL SKNCLS STERI-STRIP NONHPOA (GAUZE/BANDAGES/DRESSINGS)
BAG DRN RND TRDRP ANRFLXCHMBR (UROLOGICAL SUPPLIES)
BAG RETRIEVAL 10 (BASKET)
BAG URINE DRAIN 2000ML AR STRL (UROLOGICAL SUPPLIES) ×1 IMPLANT
BARRIER ADHS 3X4 INTERCEED (GAUZE/BANDAGES/DRESSINGS) IMPLANT
BENZOIN TINCTURE PRP APPL 2/3 (GAUZE/BANDAGES/DRESSINGS) IMPLANT
BLADE CLIPPER SENSICLIP SURGIC (BLADE) IMPLANT
BLADE EXTENDED COATED 6.5IN (ELECTRODE) IMPLANT
BLADE SURG 10 STRL SS (BLADE) ×2 IMPLANT
BRR ADH 4X3 ABS CNTRL BYND (GAUZE/BANDAGES/DRESSINGS)
CATH FOLEY 2WAY SLVR  5CC 14FR (CATHETERS)
CATH FOLEY 2WAY SLVR 5CC 14FR (CATHETERS) ×1 IMPLANT
CATH ROBINSON RED A/P 16FR (CATHETERS) ×1 IMPLANT
CELLS DAT CNTRL 66122 CELL SVR (MISCELLANEOUS) IMPLANT
CHLORAPREP W/TINT 26 (MISCELLANEOUS) ×2 IMPLANT
COVER MAYO STAND STRL (DRAPES) ×3 IMPLANT
DECANTER SPIKE VIAL GLASS SM (MISCELLANEOUS) IMPLANT
DERMABOND ADVANCED (GAUZE/BANDAGES/DRESSINGS) ×1
DERMABOND ADVANCED .7 DNX12 (GAUZE/BANDAGES/DRESSINGS) ×1 IMPLANT
DRAPE LAPAROTOMY T 102X78X121 (DRAPES) ×1 IMPLANT
DRAPE WARM FLUID 44X44 (DRAPES) ×1 IMPLANT
DRSG COVADERM PLUS 2X2 (GAUZE/BANDAGES/DRESSINGS) IMPLANT
DRSG OPSITE POSTOP 3X4 (GAUZE/BANDAGES/DRESSINGS) IMPLANT
DRSG OPSITE POSTOP 4X10 (GAUZE/BANDAGES/DRESSINGS) IMPLANT
DRSG TEGADERM 2-3/8X2-3/4 SM (GAUZE/BANDAGES/DRESSINGS) ×2 IMPLANT
DRSG TELFA 3X8 NADH (GAUZE/BANDAGES/DRESSINGS) IMPLANT
DURAPREP 26ML APPLICATOR (WOUND CARE) ×1 IMPLANT
ELECT REM PT RETURN 9FT ADLT (ELECTROSURGICAL) ×3
ELECTRODE REM PT RTRN 9FT ADLT (ELECTROSURGICAL) ×2 IMPLANT
GAUZE 4X4 16PLY ~~LOC~~+RFID DBL (SPONGE) ×4 IMPLANT
GAUZE SPONGE 2X2 12PLY NS (GAUZE/BANDAGES/DRESSINGS) ×2 IMPLANT
GAUZE SPONGE 4X4 12PLY STRL (GAUZE/BANDAGES/DRESSINGS) ×2 IMPLANT
GLOVE SURG ENC MOIS LTX SZ7 (GLOVE) ×4 IMPLANT
GOWN STRL REUS W/TWL LRG LVL3 (GOWN DISPOSABLE) ×9 IMPLANT
KIT TURNOVER CYSTO (KITS) ×3 IMPLANT
NDL HYPO 25X1 1.5 SAFETY (NEEDLE) IMPLANT
NDL SAFETY ECLIPSE 18X1.5 (NEEDLE) IMPLANT
NEEDLE HYPO 18GX1.5 SHARP (NEEDLE)
NEEDLE HYPO 22GX1.5 SAFETY (NEEDLE) IMPLANT
NEEDLE HYPO 25X1 1.5 SAFETY (NEEDLE) IMPLANT
NEEDLE INSUFFLATION 120MM (ENDOMECHANICALS) ×3 IMPLANT
NS IRRIG 1000ML POUR BTL (IV SOLUTION) ×2 IMPLANT
NS IRRIG 500ML POUR BTL (IV SOLUTION) ×3 IMPLANT
PACK ABDOMINAL GYN (CUSTOM PROCEDURE TRAY) ×1 IMPLANT
PACK LAPAROSCOPY BASIN (CUSTOM PROCEDURE TRAY) ×3 IMPLANT
PACK TRENDGUARD 450 HYBRID PRO (MISCELLANEOUS) ×1 IMPLANT
PAD DRESSING TELFA 3X8 NADH (GAUZE/BANDAGES/DRESSINGS) ×1 IMPLANT
PAD OB MATERNITY 4.3X12.25 (PERSONAL CARE ITEMS) ×3 IMPLANT
PAD PREP 24X48 CUFFED NSTRL (MISCELLANEOUS) ×3 IMPLANT
RETRACTOR WND ALEXIS 18 MED (MISCELLANEOUS) IMPLANT
RTRCTR WOUND ALEXIS 18CM MED (MISCELLANEOUS)
RTRCTR WOUND ALEXIS 18CM SML (INSTRUMENTS)
SAVER CELL AAL HAEMONETICS (INSTRUMENTS) IMPLANT
SCISSORS LAP 5X35 DISP (ENDOMECHANICALS) IMPLANT
SEALER TISSUE G2 CVD JAW 45CM (ENDOMECHANICALS) IMPLANT
SET SUCTION IRRIG HYDROSURG (IRRIGATION / IRRIGATOR) IMPLANT
SET TUBE SMOKE EVAC HIGH FLOW (TUBING) ×3 IMPLANT
SHEARS HARMONIC ACE PLUS 45CM (MISCELLANEOUS) IMPLANT
SPONGE T-LAP 18X18 ~~LOC~~+RFID (SPONGE) ×1 IMPLANT
STAPLER VISISTAT 35W (STAPLE) ×1 IMPLANT
STRIP CLOSURE SKIN 1/4X4 (GAUZE/BANDAGES/DRESSINGS) ×1 IMPLANT
SUT CHROMIC 0 CT 1 (SUTURE) ×1 IMPLANT
SUT MNCRL AB 4-0 PS2 18 (SUTURE) ×3 IMPLANT
SUT PDS AB 0 CT1 27 (SUTURE) ×2 IMPLANT
SUT VIC AB 0 CT1 18XCR BRD8 (SUTURE) ×1 IMPLANT
SUT VIC AB 0 CT1 8-18 (SUTURE)
SUT VIC AB 3-0 CT1 27 (SUTURE)
SUT VIC AB 3-0 CT1 TAPERPNT 27 (SUTURE) ×1 IMPLANT
SUT VICRYL 0 TIES 12 18 (SUTURE) ×1 IMPLANT
SUT VICRYL 0 UR6 27IN ABS (SUTURE) IMPLANT
SYR 10ML LL (SYRINGE) ×3 IMPLANT
SYR BULB IRRIG 60ML STRL (SYRINGE) ×1 IMPLANT
SYR CONTROL 10ML LL (SYRINGE) IMPLANT
SYS BAG RETRIEVAL 10MM (BASKET)
SYSTEM BAG RETRIEVAL 10MM (BASKET) IMPLANT
TOWEL OR 17X26 10 PK STRL BLUE (TOWEL DISPOSABLE) ×4 IMPLANT
TRENDGUARD 450 HYBRID PRO PACK (MISCELLANEOUS) ×3
TROCAR OPTI TIP 5M 100M (ENDOMECHANICALS) ×3 IMPLANT
TROCAR XCEL BLUNT TIP 100MML (ENDOMECHANICALS) IMPLANT
TROCAR XCEL DIL TIP R 11M (ENDOMECHANICALS) ×3 IMPLANT
WARMER LAPAROSCOPE (MISCELLANEOUS) ×1 IMPLANT
WATER STERILE IRR 1000ML POUR (IV SOLUTION) ×1 IMPLANT

## 2021-03-03 NOTE — Discharge Instructions (Addendum)
Post Anesthesia Home Care Instructions  Activity: Get plenty of rest for the remainder of the day. A responsible adult should stay with you for 24 hours following the procedure.  For the next 24 hours, DO NOT: -Drive a car -Paediatric nurse -Drink alcoholic beverages -Take any medication unless instructed by your physician -Make any legal decisions or sign important papers.  Meals: Start with liquid foods such as gelatin or soup. Progress to regular foods as tolerated. Avoid greasy, spicy, heavy foods. If nausea and/or vomiting occur, drink only clear liquids until the nausea and/or vomiting subsides. Call your physician if vomiting continues.  Special Instructions/Symptoms: Your throat may feel dry or sore from the anesthesia or the breathing tube placed in your throat during surgery. If this causes discomfort, gargle with warm salt water. The discomfort should disappear within 24 hours.  If you had a scopolamine patch placed behind your ear for the management of post- operative nausea and/or vomiting:  1. The medication in the patch is effective for 72 hours, after which it should be removed.  Wrap patch in a tissue and discard in the trash. Wash hands thoroughly with soap and water. 2. You may remove the patch earlier than 72 hours if you experience unpleasant side effects which may include dry mouth, dizziness or visual disturbances. 3. Avoid touching the patch. Wash your hands with soap and water after contact with the patch.   DISCHARGE INSTRUCTIONS: Laparoscopy  The following instructions have been prepared to help you care for yourself upon your return home today.  Wound care:  Do not get the incision wet for the first 24 hours. The incision should be kept clean and dry.  The Band-Aids or dressings may be removed the day after surgery.  Should the incision become sore, red, and swollen after the first week, check with your doctor.  Personal hygiene:  Shower the day after  your procedure.  Activity and limitations:  Do NOT drive or operate any equipment today.  Do NOT lift anything more than 15 pounds for 2-3 weeks after surgery.  Do NOT rest in bed all day.  Walking is encouraged. Walk each day, starting slowly with 5-minute walks 3 or 4 times a day. Slowly increase the length of your walks.  Walk up and down stairs slowly.  Do NOT do strenuous activities, such as golfing, playing tennis, bowling, running, biking, weight lifting, gardening, mowing, or vacuuming for 2-4 weeks. Ask your doctor when it is okay to start.  Diet: Eat a light meal as desired this evening. You may resume your usual diet tomorrow.  Return to work: This is dependent on the type of work you do. For the most part you can return to a desk job within a week of surgery. If you are more active at work, please discuss this with your doctor.  What to expect after your surgery: You may have a slight burning sensation when you urinate on the first day. You may have a very small amount of blood in the urine. Expect to have a small amount of vaginal discharge/light bleeding for 1-2 weeks. It is not unusual to have abdominal soreness and bruising for up to 2 weeks. You may be tired and need more rest for about 1 week. You may experience shoulder pain for 24-72 hours. Lying flat in bed may relieve it.  Call your doctor for any of the following:  Develop a fever of 100.4 or greater  Inability to urinate 6 hours after discharge  from hospital  Severe pain not relieved by pain medications  Persistent of heavy bleeding at incision site  Redness or swelling around incision site after a week  Increasing nausea or vomiting   May have tylenol today after 2:15 pm today   May have Advil, ibuprofen today after 2:15pm today

## 2021-03-03 NOTE — Anesthesia Postprocedure Evaluation (Signed)
Anesthesia Post Note  Patient: Barbara Scott  Procedure(s) Performed: LAPAROSCOPY DIAGNOSTIC     Patient location during evaluation: PACU Anesthesia Type: General Level of consciousness: awake and alert, oriented and patient cooperative Pain management: pain level controlled Vital Signs Assessment: post-procedure vital signs reviewed and stable Respiratory status: spontaneous breathing, nonlabored ventilation and respiratory function stable Cardiovascular status: blood pressure returned to baseline and stable Postop Assessment: no apparent nausea or vomiting Anesthetic complications: no   No notable events documented.  Last Vitals:  Vitals:   03/03/21 0900 03/03/21 0915  BP: (!) 93/53 (!) 101/59  Pulse: 66 (!) 45  Resp: 12 12  Temp:    SpO2: 100% 100%    Last Pain:  Vitals:   03/03/21 0915  TempSrc:   PainSc: 0-No pain                 Pervis Hocking

## 2021-03-03 NOTE — Progress Notes (Signed)
ISTAT K 6.8. Dr. Doroteo Glassman aware. Repeat ordered. Second K result 3.4. Dr. Doroteo Glassman aware.

## 2021-03-03 NOTE — Anesthesia Procedure Notes (Signed)
Procedure Name: Intubation Date/Time: 03/03/2021 7:46 AM Performed by: Lieutenant Diego, CRNA Pre-anesthesia Checklist: Patient identified, Emergency Drugs available, Suction available and Patient being monitored Patient Re-evaluated:Patient Re-evaluated prior to induction Oxygen Delivery Method: Circle system utilized Preoxygenation: Pre-oxygenation with 100% oxygen Induction Type: IV induction Ventilation: Mask ventilation without difficulty Laryngoscope Size: Miller and 2 Grade View: Grade I Tube type: Oral Tube size: 7.0 mm Number of attempts: 1 Airway Equipment and Method: Stylet and Oral airway Placement Confirmation: ETT inserted through vocal cords under direct vision, positive ETCO2 and breath sounds checked- equal and bilateral Secured at: 23 cm Tube secured with: Tape Dental Injury: Teeth and Oropharynx as per pre-operative assessment

## 2021-03-03 NOTE — Transfer of Care (Signed)
Immediate Anesthesia Transfer of Care Note  Patient: Barbara Scott  Procedure(s) Performed: LAPAROSCOPY DIAGNOSTIC  Patient Location: PACU  Anesthesia Type:General  Level of Consciousness: drowsy  Airway & Oxygen Therapy: Patient Spontanous Breathing and Patient connected to face mask oxygen  Post-op Assessment: Report given to RN and Post -op Vital signs reviewed and stable  Post vital signs: Reviewed and stable  Last Vitals:  Vitals Value Taken Time  BP 104/53 03/03/21 0851  Temp    Pulse 62 03/03/21 0857  Resp 11 03/03/21 0857  SpO2 100 % 03/03/21 0857  Vitals shown include unvalidated device data.  Last Pain:  Vitals:   03/03/21 0604  TempSrc: Oral         Complications: No notable events documented.

## 2021-03-03 NOTE — Op Note (Signed)
Preoperative diagnosis: Pelvic pain, history of endometriosis  Postoperative diagnosis: Same  Procedure: Diagnostic laparoscopy, lysis of minimal adhesions  Surgeon: Matthew Saras  Anesthesia: General  EBL: Less than 10 cc  Procedure findings:  Patient was taken to the operating room after an adequate level of general anesthesia was obtained with the patient's legs in stirrups the abdomen perineum and vagina were prepped and draped in the bladder was drained.  This was done after appropriate timeouts were taken.  The subumbilical area after prepping and draping, was infiltrated with quarter percent Marcaine plain, small incision was made and the varies needle was introduced that difficulty.  Its intra-abdominal position was verified by pressure water testing.  After 2-1/2 L pneumoperitoneum was then created, laparoscopic trocar and sleeve were then introduced that difficulty.  3 fingerbreadths above symphysis in the midline, a 5 mm trocar was placed under direct visualization.  The patient was then placed in Trendelenburg and the pelvic findings as follows:  Upper abdomen was unremarkable the right remaining ovary and appendix were completely normal.  Uterus left tube and ovary were surgically absent.  Along the left pelvic sidewall was some whitish minimal tissue that was photo documented this was right over top the course of the ureter which could be seen with peristalsis.  This appeared to be old scar tissue and not ovarian remnant, did not really have typical appearance of endometriosis.  There was some similar area along the right broad ligament area.  Certainly there were no bowel adhesions into the vaginal cuff area, there were some minimal filmy adhesions on the left side which were lysed just from manipulation, but no other significant findings noted.  These areas were photo documented, instruments were removed, gas allowed escape upper defect closed with a 4-0 Vicryl subcuticular, Dermabond on  the lower.  She tolerated this well went to recovery room in good condition.  Dictated with [employer]  Margarette Asal MD

## 2021-03-03 NOTE — Progress Notes (Signed)
The patient was re-examined with no change in status 

## 2021-03-04 ENCOUNTER — Encounter (HOSPITAL_BASED_OUTPATIENT_CLINIC_OR_DEPARTMENT_OTHER): Payer: Self-pay | Admitting: Obstetrics and Gynecology

## 2021-03-05 LAB — POCT I-STAT, CHEM 8
BUN: 18 mg/dL (ref 6–20)
Calcium, Ion: 1.02 mmol/L — ABNORMAL LOW (ref 1.15–1.40)
Chloride: 103 mmol/L (ref 98–111)
Creatinine, Ser: 0.8 mg/dL (ref 0.44–1.00)
Glucose, Bld: 91 mg/dL (ref 70–99)
HCT: 41 % (ref 36.0–46.0)
Hemoglobin: 13.9 g/dL (ref 12.0–15.0)
Potassium: 6.8 mmol/L (ref 3.5–5.1)
Sodium: 137 mmol/L (ref 135–145)
TCO2: 30 mmol/L (ref 22–32)

## 2021-03-05 NOTE — Discharge Summary (Signed)
Physician Discharge Summary  Patient ID: Barbara Scott MRN: [id number] DOB/AGE: Nov 17, 1985 35 y.o.  Admit date: 03/03/2021 Discharge date: 03/05/2021  Admission Diagnoses:pelvic pain, hx endometriosis  Discharge Diagnoses: same Principal Problem:   Pelvic pain   Discharged Condition: good  Hospital Course: OPT DL to eval pelvic pain, findings as noted, D/C from PACU  Consults: none  Significant Diagnostic Studies: labs: No results found for this or any previous visit (from the past 24 hour(s)).   Treatments: surgery: DL  Discharge Exam: Blood pressure 108/71, pulse 66, temperature 97.8 F (36.6 C), resp. rate 18, height 5\' 5"  (1.651 m), weight 93.4 kg, last menstrual period 01/20/2017, SpO2 99 %, unknown if currently breastfeeding. Abd soft + BS, nontender  Disposition: Discharge disposition: 01-Home or Self Care        Allergies as of 03/03/2021       Reactions   Bee Venom Swelling   Shrimp [shellfish Allergy] Swelling   FACIAL SWELLING   Hydrocodone Itching        Medication List     ASK your doctor about these medications    aspirin-acetaminophen-caffeine 250-250-65 MG tablet Commonly known as: EXCEDRIN MIGRAINE Take by mouth every 6 (six) hours as needed for headache.   EPINEPHrine 0.15 MG/0.3ML injection Commonly known as: EPIPEN JR Inject 0.15 mg into the muscle as needed for anaphylaxis.   hydrochlorothiazide 25 MG tablet Commonly known as: HYDRODIURIL Take 25 mg by mouth daily.        Follow-up Information     Molli Posey, MD Follow up in 1 week(s).   Specialty: Obstetrics and Gynecology Contact information: [address] [place] [address] [phone]                 Signed: Margarette Asal 03/05/2021, 7:08 AM

## 2021-03-16 NOTE — Discharge Summary (Signed)
NAME: Barbara Scott, Barbara Scott MEDICAL RECORD NO: [id number] ACCOUNT NO: [id number] DATE OF BIRTH: [birth year] FACILITY: [facility] LOCATION: [facility] PHYSICIAN: Ralene Bathe. Matthew Saras, MD  Discharge Summary   DATE OF DISCHARGE: 03/03/2021  ADMISSION DATE:  03/03/2021  DISCHARGE DATE:  03/03/2021.  ADMISSION DIAGNOSIS:  Pelvic pain, history of endometriosis.  DISCHARGE DIAGNOSIS:  Pelvic pain, history of endometriosis.  PRINCIPAL PROBLEM:  Pelvic pain.  DISCHARGE CONDITION:  Good.  HOSPITAL COURSE:  Outpatient diagnostic laparoscopy for evaluation of pelvic pain.  Findings as noted in the op note.  Discharged from the recovery room.  CONSULTS:  None.  LABORATORY:  Only the outpatient preoperative lab as noted.  TREATMENT:  Surgery, laparoscopy.  DISCHARGE EXAM: VITAL SIGNS:  Blood pressure 108/71, pulse 66, temperature 97.8. ABDOMEN:  Soft, flat, nontender.  DISPOSITION:  Discharged from the PACU today.  Follow up in my office in 2 weeks.   PUS D: 03/16/2021 11:23:36 am T: 03/16/2021 3:27:00 pm  JOB: [id number]/ [id number]

## 2021-04-14 DIAGNOSIS — N8000 Endometriosis of the uterus, unspecified: Secondary | ICD-10-CM | POA: Diagnosis not present

## 2021-04-14 DIAGNOSIS — Z01419 Encounter for gynecological examination (general) (routine) without abnormal findings: Secondary | ICD-10-CM | POA: Diagnosis not present

## 2021-04-14 DIAGNOSIS — Z6832 Body mass index (BMI) 32.0-32.9, adult: Secondary | ICD-10-CM | POA: Diagnosis not present

## 2021-05-12 DIAGNOSIS — N390 Urinary tract infection, site not specified: Secondary | ICD-10-CM | POA: Diagnosis not present

## 2021-07-21 DIAGNOSIS — R102 Pelvic and perineal pain: Secondary | ICD-10-CM | POA: Diagnosis not present

## 2021-07-21 DIAGNOSIS — N809 Endometriosis, unspecified: Secondary | ICD-10-CM | POA: Diagnosis not present

## 2021-10-15 DIAGNOSIS — N809 Endometriosis, unspecified: Secondary | ICD-10-CM | POA: Diagnosis not present

## 2021-10-16 ENCOUNTER — Other Ambulatory Visit (HOSPITAL_COMMUNITY): Payer: Self-pay

## 2021-10-16 DIAGNOSIS — Z0000 Encounter for general adult medical examination without abnormal findings: Secondary | ICD-10-CM | POA: Diagnosis not present

## 2021-10-16 DIAGNOSIS — I10 Essential (primary) hypertension: Secondary | ICD-10-CM | POA: Diagnosis not present

## 2021-10-16 DIAGNOSIS — R011 Cardiac murmur, unspecified: Secondary | ICD-10-CM | POA: Diagnosis not present

## 2021-10-16 DIAGNOSIS — E6609 Other obesity due to excess calories: Secondary | ICD-10-CM | POA: Diagnosis not present

## 2021-10-16 MED ORDER — MOUNJARO 7.5 MG/0.5ML ~~LOC~~ SOAJ
SUBCUTANEOUS | 1 refills | Status: DC
  Filled 2021-10-16: qty 6, 84d supply, fill #0
  Filled 2022-01-01: qty 6, 84d supply, fill #1

## 2021-10-20 DIAGNOSIS — Z0000 Encounter for general adult medical examination without abnormal findings: Secondary | ICD-10-CM | POA: Diagnosis not present

## 2021-10-20 DIAGNOSIS — I10 Essential (primary) hypertension: Secondary | ICD-10-CM | POA: Diagnosis not present

## 2021-10-23 ENCOUNTER — Other Ambulatory Visit (HOSPITAL_COMMUNITY): Payer: Self-pay

## 2021-10-23 MED ORDER — HYDROCHLOROTHIAZIDE 25 MG PO TABS
ORAL_TABLET | ORAL | 5 refills | Status: DC
  Filled 2021-10-23: qty 90, 90d supply, fill #0

## 2021-11-02 IMAGING — CT CT RENAL STONE PROTOCOL
2 of 4 series · 16 of 46 positions shown, 18 images · non-contrast
Comparison: None.

CLINICAL DATA: Lower abdominal pain.

EXAM:
CT ABDOMEN AND PELVIS WITHOUT CONTRAST
TECHNIQUE: Multidetector CT imaging of the abdomen and pelvis was performed
following the standard protocol without IV contrast.

[Series 2: axial st · axial · 0.64mm/px · z∈[+1001,+1426]mm · 13 of 95 slices shown, 15 images]
[im 5/95  soft-tissue]
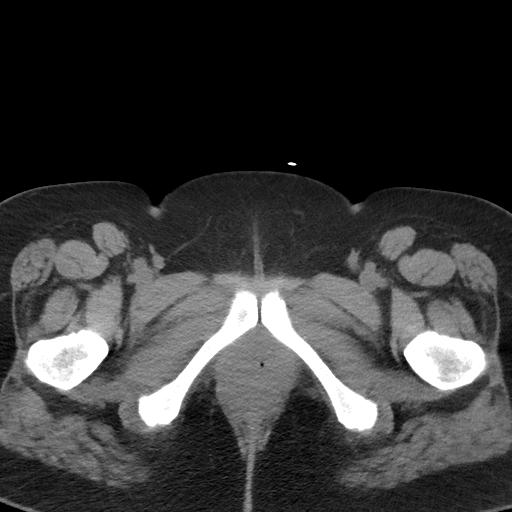
[im 5/95  bone]
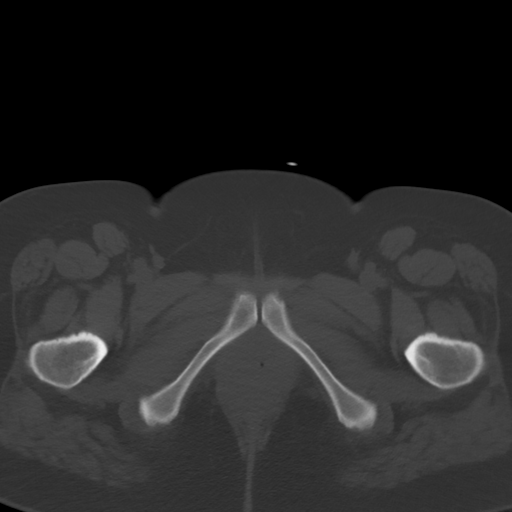
[im 15/95  soft-tissue]
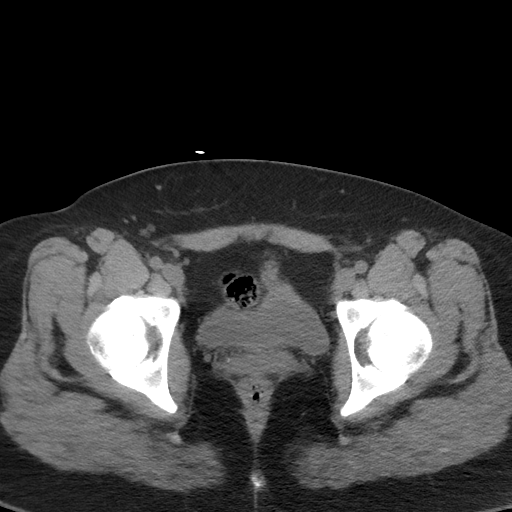
[im 20/95  soft-tissue]
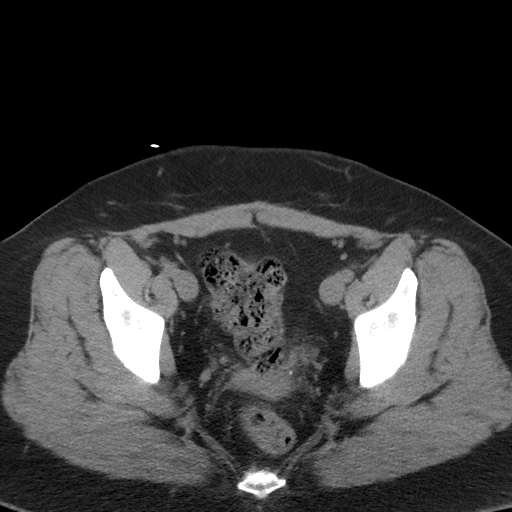
[im 25/95  soft-tissue]
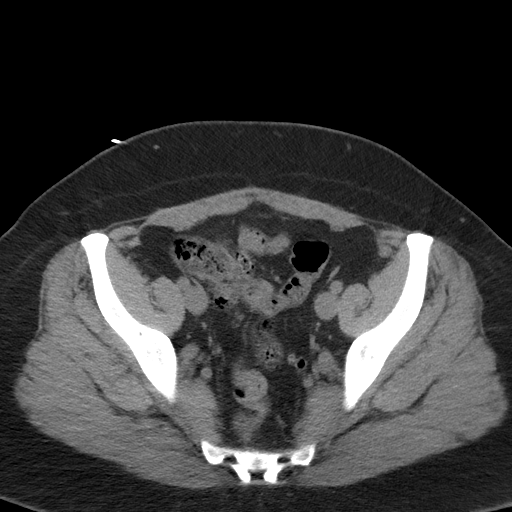
[im 35/95  soft-tissue]
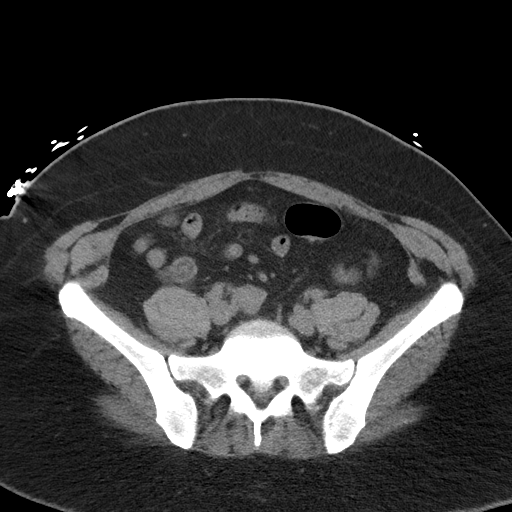
[im 40/95  soft-tissue]
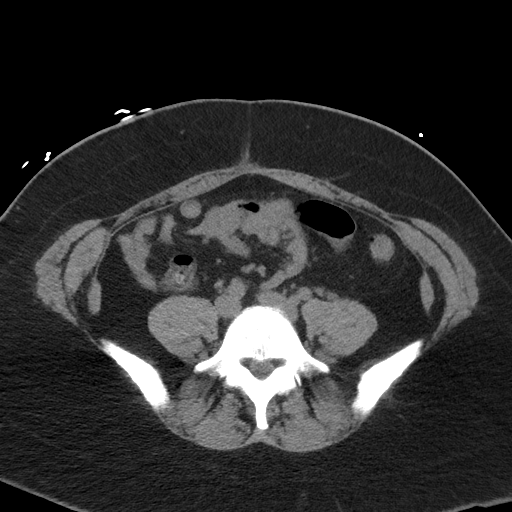
[im 50/95  soft-tissue]
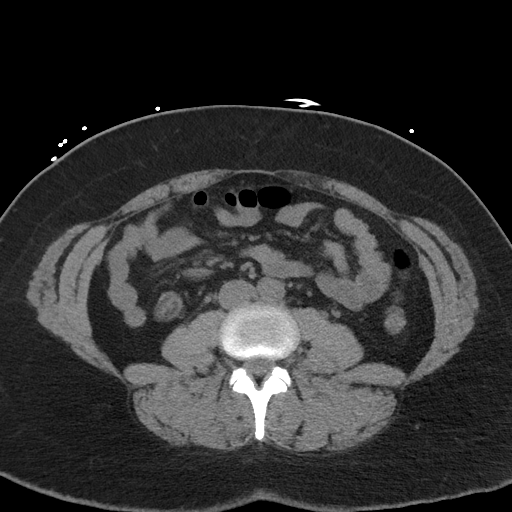
[im 55/95  soft-tissue]
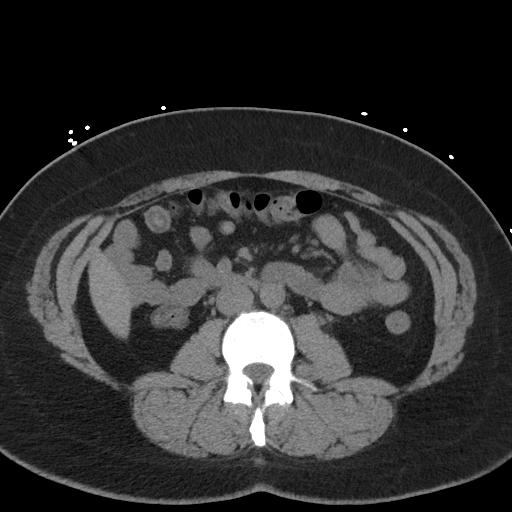
[im 60/95  soft-tissue]
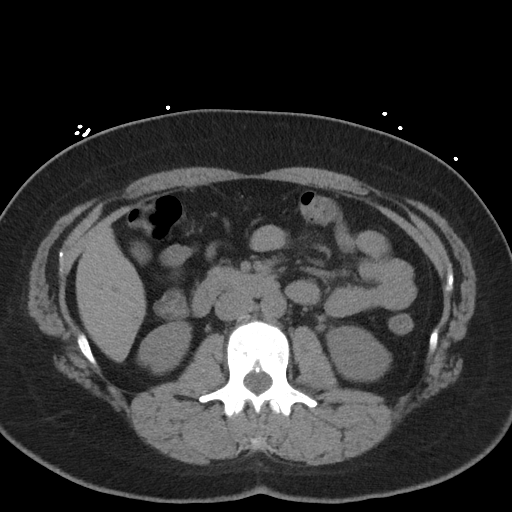
[im 60/95  bone]
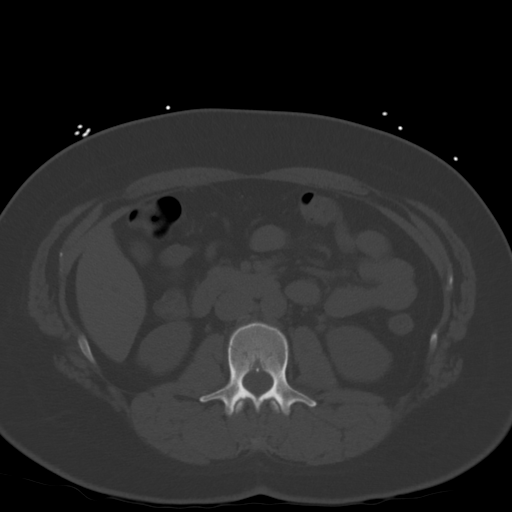
[im 70/95  soft-tissue]
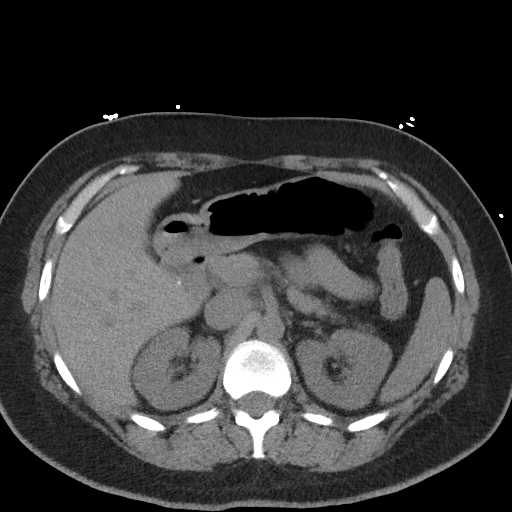
[im 75/95  soft-tissue]
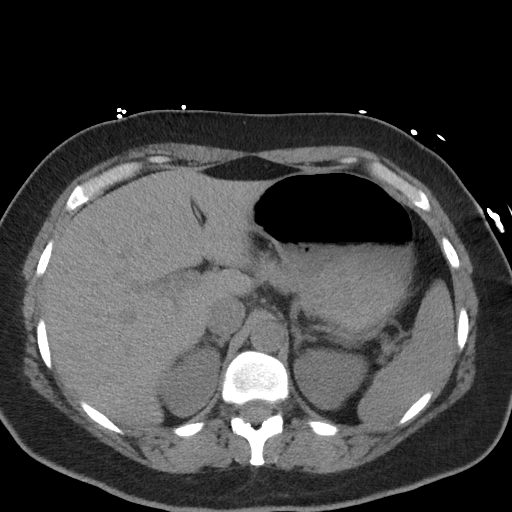
[im 80/95  soft-tissue]
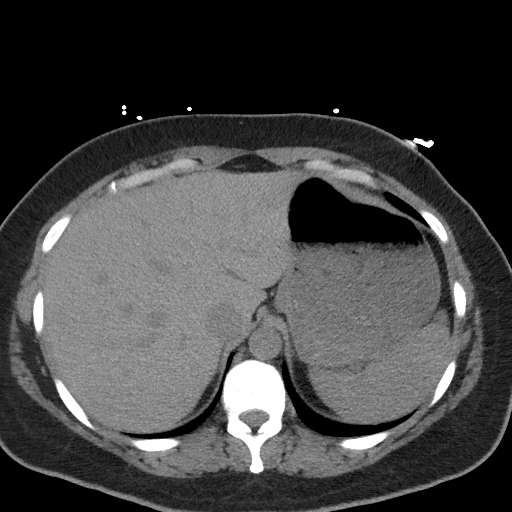
[im 90/95  soft-tissue]
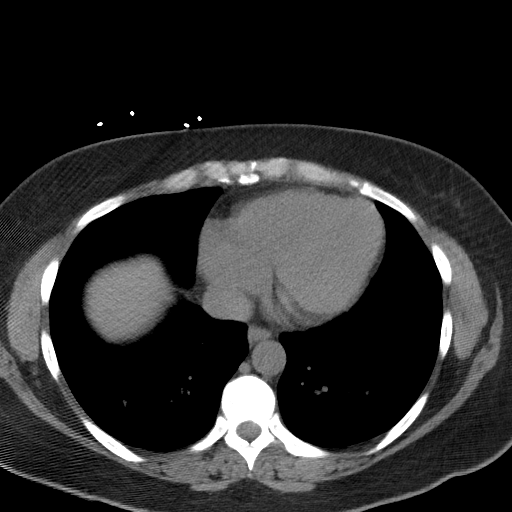

[Series 5: coronal st · coronal · 0.67mm/px · 3 of 82 slices shown]
[im 28/82  soft-tissue]
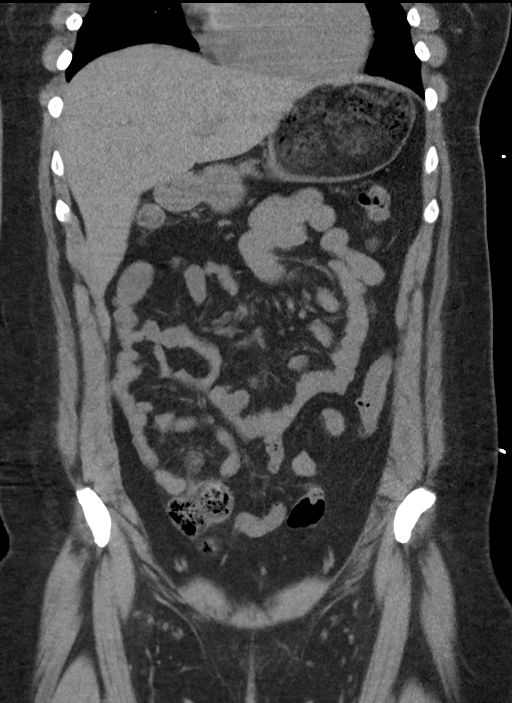
[im 37/82  soft-tissue]
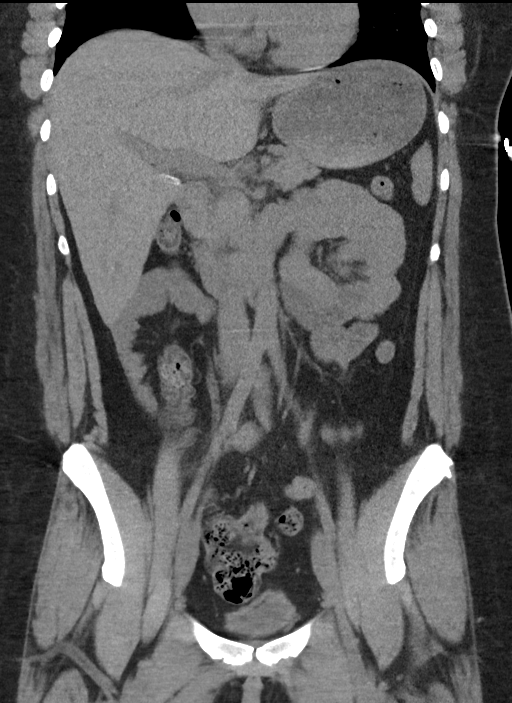
[im 46/82  soft-tissue]
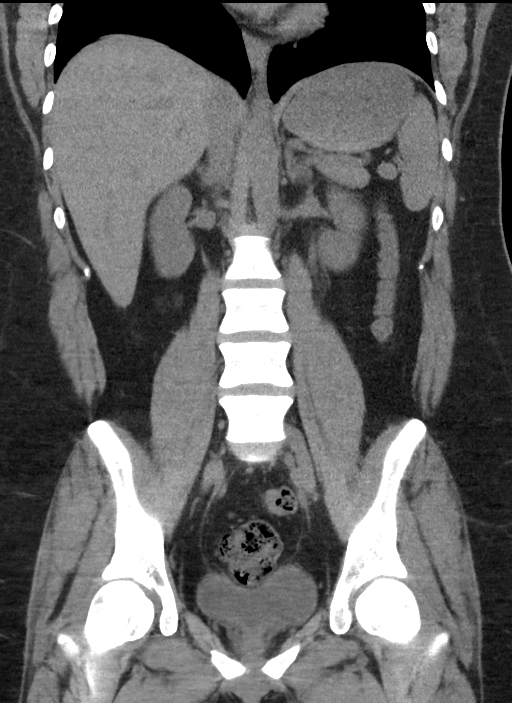

[16 of 46 positions shown; findings below may reference images not displayed]

FINDINGS: Lower chest: No acute abnormality.

Hepatobiliary: No focal liver abnormality is seen. Status post
cholecystectomy. No biliary dilatation.

Pancreas: Unremarkable. No pancreatic ductal dilatation or
surrounding inflammatory changes.

Spleen: Normal in size without focal abnormality.

Adrenals/Urinary Tract: Adrenal glands are unremarkable. Kidneys are
normal, without renal calculi, focal lesion, or hydronephrosis.
Bladder is unremarkable.

Stomach/Bowel: Stomach is within normal limits. Appendix appears
normal. No evidence of bowel wall thickening, distention, or
inflammatory changes.

Vascular/Lymphatic: No significant vascular findings are present. No
enlarged abdominal or pelvic lymph nodes.

Reproductive: Status post hysterectomy. No adnexal masses.

Other: No abdominal wall hernia or abnormality. No abdominopelvic
ascites.

Musculoskeletal: No acute or significant osseous findings.
IMPRESSION: 1. No acute intra-abdominal findings.
2. Status post cholecystectomy and hysterectomy.

## 2021-12-19 NOTE — Progress Notes (Deleted)
Cardiology Office Note:    Date:  12/22/2021   ID:  Barbara Scott, DOB 1986-02-25, MRN [id number]  PCP:  Doyle Askew, PA-C  Cardiologist:  None  Electrophysiologist:  None   Referring MD: Doyle Askew, PA-C   Chief Complaint/Reason for Referral: ***  History of Present Illness:    Barbara Scott is a 36 y.o. female with a history of *** Mother is my patient Shireen Quan. Murmur noted. Son with 8 with congenital heart disease.  Past Medical History:  Diagnosis Date   Family history of breast cancer    Family history of melanoma    Family history of prostate cancer    History of anemia    with pregnancy only   History of pregnancy induced hypertension    Hypertension    Migraines    otc med   Pelvic pain    Wears contact lenses     Past Surgical History:  Procedure Laterality Date   CESAREAN SECTION WITH BILATERAL TUBAL LIGATION Bilateral 02/05/2015   Procedure: CESAREAN SECTION WITH BILATERAL TUBAL LIGATION;  Surgeon: Molli Posey, MD;  Location: [facility];  Service: Obstetrics;  Laterality: Bilateral;   CHOLECYSTECTOMY, LAPAROSCOPIC  2008   DILATION AND EVACUATION N/A 01/01/2014   Procedure: DILATATION AND EVACUATION;  Surgeon: Margarette Asal, MD;  Location: [facility];  Service: Gynecology;  Laterality: N/A;   KNEE ARTHROSCOPY Right 2009   LAPAROSCOPIC ASSISTED VAGINAL HYSTERECTOMY N/A 01/25/2017   Procedure: LAPAROSCOPIC ASSISTED VAGINAL HYSTERECTOMY WITH LEFT OOPHERECTOMY;  Surgeon: Molli Posey, MD;  Location: [facility];  Service: Gynecology;  Laterality: N/A;  request Dortha Kern   LAPAROSCOPY N/A 03/03/2021   Procedure: LAPAROSCOPY DIAGNOSTIC;  Surgeon: Molli Posey, MD;  Location: [facility];  Service: Gynecology;  Laterality: N/A;   LASIK Bilateral    LESION REMOVAL N/A 02/02/2014   Procedure: MINOR EXICISION OF LESION X3  RIGHT AXILLA, CHEST AND ABDOMEN;  Surgeon: Alphonsa Overall, MD;  Location: [facility];   Service: General;  Laterality: N/A;   NEVUS EXCISION  08/07/2004   '@[facility]'$ ;  multiple areas   TONSILLECTOMY     age 6    Current Medications: No outpatient medications have been marked as taking for the 12/22/21 encounter (Appointment) with Elouise Munroe, MD.     Allergies:   Bee venom, Shrimp [shellfish allergy], and Hydrocodone   Social History   Tobacco Use   Smoking status: Never   Smokeless tobacco: Never  Vaping Use   Vaping Use: Never used  Substance Use Topics   Alcohol use: Not Currently    Comment: occasional   Drug use: Never     Family History: The patient's family history includes Cancer in her father; Heart disease in her maternal uncle; Hypertension in her maternal uncle and mother; Lung cancer in her maternal grandfather; Melanoma in her mother; Prostate cancer in her maternal grandfather and paternal grandfather; Thyroid disease in her mother.  ROS:   Please see the history of present illness.    All other systems reviewed and are negative.  EKGs/Labs/Other Studies Reviewed:    The following studies were reviewed today:  EKG:  ***  Imaging studies that I have independently reviewed today: ***  Recent Labs: 01/30/2021: Platelets 355 03/03/2021: BUN 13; Creatinine, Ser 0.80; Hemoglobin 12.9; Potassium 3.4; Sodium 141  Recent Lipid Panel    Component Value Date/Time   CHOL 152 09/21/2019 1205   TRIG 74 09/21/2019 1205   HDL 49 (L)  09/21/2019 1205   CHOLHDL 3.1 09/21/2019 1205   LDLCALC 87 09/21/2019 1205    Physical Exam:    VS:  LMP 01/20/2017 (Exact Date)     Wt Readings from Last 5 Encounters:  03/03/21 205 lb 14.4 oz (93.4 kg)  01/30/21 205 lb (93 kg)  09/21/19 203 lb 1.9 oz (92.1 kg)  01/27/16 176 lb (79.8 kg)  02/06/15 219 lb 11.2 oz (99.7 kg)    Constitutional: No acute distress Eyes: sclera non-icteric, normal conjunctiva and lids ENMT: normal dentition, moist mucous membranes Cardiovascular: regular rhythm, normal rate, no  murmur. S1 and S2 normal. No jugular venous distention.  Respiratory: clear to auscultation bilaterally GI : normal bowel sounds, soft and nontender. No distention.   MSK: extremities warm, well perfused. No edema.  NEURO: grossly nonfocal exam, moves all extremities. PSYCH: alert and oriented x 3, normal mood and affect.   ASSESSMENT:    No diagnosis found. PLAN:    No diagnosis found.  Total time of encounter: *** minutes total time of encounter, including *** minutes spent in face-to-face patient care on the date of this encounter. This time includes coordination of care and counseling regarding above mentioned problem list. Remainder of non-face-to-face time involved reviewing chart documents/testing relevant to the patient encounter and documentation in the medical record. I have independently reviewed documentation from referring provider.   Cherlynn Kaiser, MD, [place]   Shared Decision Making/Informed Consent:   {Are you ordering a CV Procedure (e.g. stress test, cath, DCCV, TEE, etc)?   Press F2        :[id number]}   Medication Adjustments/Labs and Tests Ordered: Current medicines are reviewed at length with the patient today.  Concerns regarding medicines are outlined above.   No orders of the defined types were placed in this encounter.   No orders of the defined types were placed in this encounter.   There are no Patient Instructions on file for this visit.

## 2021-12-22 ENCOUNTER — Encounter: Payer: Self-pay | Admitting: Internal Medicine

## 2021-12-22 ENCOUNTER — Ambulatory Visit: Payer: 59 | Attending: Internal Medicine | Admitting: Internal Medicine

## 2021-12-22 VITALS — BP 110/82 | HR 65 | Ht 65.0 in | Wt 169.0 lb

## 2021-12-22 DIAGNOSIS — Z8279 Family history of other congenital malformations, deformations and chromosomal abnormalities: Secondary | ICD-10-CM

## 2021-12-22 DIAGNOSIS — R011 Cardiac murmur, unspecified: Secondary | ICD-10-CM

## 2021-12-22 DIAGNOSIS — O133 Gestational [pregnancy-induced] hypertension without significant proteinuria, third trimester: Secondary | ICD-10-CM | POA: Diagnosis not present

## 2021-12-22 NOTE — Progress Notes (Signed)
Cardiology Office Note:    Date:  12/22/2021   ID:  Barbara Scott, DOB 1985-11-08, MRN [id number]  PCP:  Doyle Askew, PA-C  Cardiologist:  None  Electrophysiologist:  None   Referring MD: Doyle Askew, PA-C   Chief Complaint/Reason for Referral: CC: heart murmur  History of Present Illness:    Barbara Scott is a 36 y.o. female with a history of HTN and anemia, here for the evaluation of heart murmur.  Today:  Her son was recently diagnosed with congential heart disease. He has a bicuspid aortic valve, and a probable PFO.  She says a doctor told her that an "extra artery" in her son had disappeared, unclear what this was but suspect possible PDA? She has another son who has not yet been screened.  She had preeclampsia during both pregnancies and delivered both pregnancies earlier, at 65 and 35 weeks.  She was diagnosed with a heart murmur last year. She had never been told about a murmur before.  She has mild chest discomfort when she exercise, but she attributes this to lack of exercise. She has only ever had leg swelling during her pregnancy.   The patient denies dyspnea at rest or with exertion, palpitations, PND, orthopnea, or leg swelling. Denies cough, fever, chills. Denies nausea, vomiting. Denies syncope or presyncope. Denies dizziness or lightheadedness. Denies snoring.   Past Medical History:  Diagnosis Date   Family history of breast cancer    Family history of melanoma    Family history of prostate cancer    History of anemia    with pregnancy only   History of pregnancy induced hypertension    Hypertension    Migraines    otc med   Pelvic pain    Wears contact lenses     Past Surgical History:  Procedure Laterality Date   CESAREAN SECTION WITH BILATERAL TUBAL LIGATION Bilateral 02/05/2015   Procedure: CESAREAN SECTION WITH BILATERAL TUBAL LIGATION;  Surgeon: Molli Posey, MD;  Location: [facility];  Service: Obstetrics;  Laterality:  Bilateral;   CHOLECYSTECTOMY, LAPAROSCOPIC  2008   DILATION AND EVACUATION N/A 01/01/2014   Procedure: DILATATION AND EVACUATION;  Surgeon: Margarette Asal, MD;  Location: [facility];  Service: Gynecology;  Laterality: N/A;   KNEE ARTHROSCOPY Right 2009   LAPAROSCOPIC ASSISTED VAGINAL HYSTERECTOMY N/A 01/25/2017   Procedure: LAPAROSCOPIC ASSISTED VAGINAL HYSTERECTOMY WITH LEFT OOPHERECTOMY;  Surgeon: Molli Posey, MD;  Location: [facility];  Service: Gynecology;  Laterality: N/A;  request Dortha Kern   LAPAROSCOPY N/A 03/03/2021   Procedure: LAPAROSCOPY DIAGNOSTIC;  Surgeon: Molli Posey, MD;  Location: [facility];  Service: Gynecology;  Laterality: N/A;   LASIK Bilateral    LESION REMOVAL N/A 02/02/2014   Procedure: MINOR EXICISION OF LESION X3  RIGHT AXILLA, CHEST AND ABDOMEN;  Surgeon: Alphonsa Overall, MD;  Location: [facility];  Service: General;  Laterality: N/A;   NEVUS EXCISION  08/07/2004   '@[facility]'$ ;  multiple areas   TONSILLECTOMY     age 66    Current Medications: Current Meds  Medication Sig   aspirin-acetaminophen-caffeine (EXCEDRIN MIGRAINE) 250-250-65 MG tablet Take by mouth every 6 (six) hours as needed for headache.   EPINEPHrine (EPIPEN JR) 0.15 MG/0.3ML injection Inject 0.15 mg into the muscle as needed for anaphylaxis.   hydrochlorothiazide (HYDRODIURIL) 25 MG tablet Take 25 mg by mouth daily.   tirzepatide [facility]) 7.5 MG/0.5ML Pen Inject 7.'5mg'$  into the skin once weekly     Allergies:  Bee venom, Shrimp [shellfish allergy], and Hydrocodone   Social History   Tobacco Use   Smoking status: Never   Smokeless tobacco: Never  Vaping Use   Vaping Use: Never used  Substance Use Topics   Alcohol use: Not Currently    Comment: occasional   Drug use: Never     Family History: The patient's family history includes Cancer in her father; Heart disease in her maternal uncle; Hypertension in her maternal uncle and mother; Lung cancer in her  maternal grandfather; Melanoma in her mother; Prostate cancer in her maternal grandfather and paternal grandfather; Thyroid disease in her mother.  ROS:   Please see the history of present illness.     No pertinent symptoms mentioned.  All other systems reviewed and are negative.  EKGs/Labs/Other Studies Reviewed:    The following studies were reviewed today:  No relevant studies reviewed today.  EKG: EKG is personally reviewed 12/22/21: NSR  Imaging studies that I have independently reviewed today: n/a  Recent Labs: 01/30/2021: Platelets 355 03/03/2021: BUN 13; Creatinine, Ser 0.80; Hemoglobin 12.9; Potassium 3.4; Sodium 141   Recent Lipid Panel    Component Value Date/Time   CHOL 152 09/21/2019 1205   TRIG 74 09/21/2019 1205   HDL 49 (L) 09/21/2019 1205   CHOLHDL 3.1 09/21/2019 1205   LDLCALC 87 09/21/2019 1205    Physical Exam:    VS:  BP 110/82   Pulse 65   Ht '5\' 5"'$  (1.651 m)   Wt 169 lb (76.7 kg)   LMP 01/20/2017 (Exact Date)   SpO2 98%   BMI 28.12 kg/m     Wt Readings from Last 5 Encounters:  03/03/21 205 lb 14.4 oz (93.4 kg)  01/30/21 205 lb (93 kg)  09/21/19 203 lb 1.9 oz (92.1 kg)  01/27/16 176 lb (79.8 kg)  02/06/15 219 lb 11.2 oz (99.7 kg)    Constitutional: No acute distress Eyes: sclera non-icteric, normal conjunctiva and lids ENMT: normal dentition, moist mucous membranes Cardiovascular: regular rhythm, normal rate, 1/6 systolic murmur, faint. S1 and S2 normal. No jugular venous distention.  Respiratory: clear to auscultation bilaterally GI : normal bowel sounds, soft and nontender. No distention.   MSK: extremities warm, well perfused. No edema.  NEURO: grossly nonfocal exam, moves all extremities. PSYCH: alert and oriented x 3, normal mood and affect.   ASSESSMENT:    1. Family history of bicuspid aortic valve   2. Murmur   3. Gestational hypertension, third trimester    PLAN:    Family history of bicuspid aortic valve - Plan:  ECHOCARDIOGRAM COMPLETE, EKG 12-Lead  Murmur - Plan: ECHOCARDIOGRAM COMPLETE, EKG 12-Lead  Gestational hypertension, third trimester  She is interested in defining her aortic valve morphology in the setting of her son having a bicuspid aortic valve.  He is not known to have coarctation of the aorta or dilated ascending aorta.  We discussed the natural history of bicuspid aortic valve, associated aortopathy and associated congenital defects.  We will screen for all of these with an echocardiogram to start.  She has no other concerning symptoms, and her chest pain symptoms are mild and likely noncardiac, however if evaluation of the ascending aorta or valve are needed, we will consider a cardiac CTA for valve and aorta evaluation.  Cherlynn Kaiser, MD, [place]   Shared Decision Making/Informed Consent:       Medication Adjustments/Labs and Tests Ordered: Current medicines are reviewed at length with the patient today.  Concerns regarding medicines are outlined above.   Orders Placed This Encounter  Procedures   EKG 12-Lead   ECHOCARDIOGRAM COMPLETE    No orders of the defined types were placed in this encounter.   Patient Instructions  Medication Instructions:  No Changes In Medications at this time.  *If you need a refill on your cardiac medications before your next appointment, please call your pharmacy*  Testing/Procedures: Your physician has requested that you have an echocardiogram. Echocardiography is a painless test that uses sound waves to create images of your heart. It provides your doctor with information about the size and shape of your heart and how well your heart's chambers and valves are working. You may receive an ultrasound enhancing agent through an IV if needed to better visualize your heart during the echo.This procedure takes approximately one hour. There are no restrictions for this procedure. This will take place at the [address].   Follow-Up: At [facility], you and your health needs are our priority.  As part of our continuing mission to provide you with exceptional heart care, we have created designated Provider Care Teams.  These Care Teams include your primary Cardiologist (physician) and Advanced Practice Providers (APPs -  Physician Assistants and Nurse Practitioners) who all work together to provide you with the care you need, when you need it.  Your next appointment:   AS NEEDED   The format for your next appointment:   In Person  Provider:   Elouise Munroe, MD              I,Mary Mosetta Pigeon Buren,acting as a scribe for Elouise Munroe, MD.,have documented all relevant documentation on the behalf of Elouise Munroe, MD,as directed by  Elouise Munroe, MD while in the presence of Elouise Munroe, MD.  I, Elouise Munroe, MD, have reviewed all documentation for the visit on 12/22/2021. The documentation on today's date of service for the exam, diagnosis, procedures, and orders are all accurate and complete.

## 2021-12-22 NOTE — Patient Instructions (Signed)
Medication Instructions:  No Changes In Medications at this time.  *If you need a refill on your cardiac medications before your next appointment, please call your pharmacy*  Testing/Procedures: Your physician has requested that you have an echocardiogram. Echocardiography is a painless test that uses sound waves to create images of your heart. It provides your doctor with information about the size and shape of your heart and how well your heart's chambers and valves are working. You may receive an ultrasound enhancing agent through an IV if needed to better visualize your heart during the echo.This procedure takes approximately one hour. There are no restrictions for this procedure. This will take place at the [address].   Follow-Up: At [facility], you and your health needs are our priority.  As part of our continuing mission to provide you with exceptional heart care, we have created designated Provider Care Teams.  These Care Teams include your primary Cardiologist (physician) and Advanced Practice Providers (APPs -  Physician Assistants and Nurse Practitioners) who all work together to provide you with the care you need, when you need it.  Your next appointment:   AS NEEDED   The format for your next appointment:   In Person  Provider:   Elouise Munroe, MD

## 2021-12-23 NOTE — Progress Notes (Signed)
H&V Care Navigation CSW Progress Note  Clinical Social Worker  reviewed chart  as pt initially populated as uninsured. Noted insurance on file. No additional needs noted.  Patient is participating in a Managed Medicaid Plan:  No, commercial insurance.   SDOH Screenings   Depression (PHQ2-9): Low Risk  (09/21/2019)  Tobacco Use: Low Risk  (12/22/2021)    Westley Hummer, MSW, [place]  [phone]- work cell phone (preferred) [phone]- desk phone

## 2021-12-24 ENCOUNTER — Ambulatory Visit (INDEPENDENT_AMBULATORY_CARE_PROVIDER_SITE_OTHER): Payer: 59

## 2021-12-24 DIAGNOSIS — Z8279 Family history of other congenital malformations, deformations and chromosomal abnormalities: Secondary | ICD-10-CM | POA: Diagnosis not present

## 2021-12-24 DIAGNOSIS — R011 Cardiac murmur, unspecified: Secondary | ICD-10-CM

## 2021-12-24 LAB — ECHOCARDIOGRAM COMPLETE
Area-P 1/2: 3.21 cm2
S' Lateral: 2.5 cm

## 2021-12-25 ENCOUNTER — Other Ambulatory Visit: Payer: Self-pay

## 2021-12-25 ENCOUNTER — Other Ambulatory Visit (HOSPITAL_BASED_OUTPATIENT_CLINIC_OR_DEPARTMENT_OTHER): Payer: 59

## 2021-12-25 ENCOUNTER — Encounter: Payer: Self-pay | Admitting: Internal Medicine

## 2021-12-25 DIAGNOSIS — Z8279 Family history of other congenital malformations, deformations and chromosomal abnormalities: Secondary | ICD-10-CM

## 2021-12-25 DIAGNOSIS — I77819 Aortic ectasia, unspecified site: Secondary | ICD-10-CM

## 2021-12-30 DIAGNOSIS — I77819 Aortic ectasia, unspecified site: Secondary | ICD-10-CM | POA: Diagnosis not present

## 2021-12-30 DIAGNOSIS — Z8279 Family history of other congenital malformations, deformations and chromosomal abnormalities: Secondary | ICD-10-CM | POA: Diagnosis not present

## 2021-12-30 LAB — BASIC METABOLIC PANEL
BUN/Creatinine Ratio: 14 (ref 9–23)
BUN: 11 mg/dL (ref 6–20)
CO2: 27 mmol/L (ref 20–29)
Calcium: 9.6 mg/dL (ref 8.7–10.2)
Chloride: 98 mmol/L (ref 96–106)
Creatinine, Ser: 0.79 mg/dL (ref 0.57–1.00)
Glucose: 80 mg/dL (ref 70–99)
Potassium: 3.8 mmol/L (ref 3.5–5.2)
Sodium: 137 mmol/L (ref 134–144)
eGFR: 99 mL/min/{1.73_m2} (ref >59)

## 2021-12-31 ENCOUNTER — Telehealth (HOSPITAL_COMMUNITY): Payer: Self-pay | Admitting: Emergency Medicine

## 2021-12-31 NOTE — Telephone Encounter (Signed)
Reaching out to patient to offer assistance regarding upcoming cardiac imaging study; pt verbalizes understanding of appt date/time, parking situation and where to check in, pre-test NPO status and medications ordered, and verified current allergies; name and call back number provided for further questions should they arise Barbara Bond RN Navigator Cardiac Imaging [facility] [phone] office [phone] cell   Arrival 330 w/c entrance Denies iv issues Holding excedrine, HCTZ

## 2022-01-02 ENCOUNTER — Other Ambulatory Visit (HOSPITAL_COMMUNITY): Payer: Self-pay

## 2022-01-02 ENCOUNTER — Ambulatory Visit (HOSPITAL_COMMUNITY)
Admission: RE | Admit: 2022-01-02 | Discharge: 2022-01-02 | Disposition: A | Discharge status: Home / Self Care | Payer: 59 | Source: Ambulatory Visit | Attending: Internal Medicine | Admitting: Internal Medicine

## 2022-01-02 DIAGNOSIS — Z8279 Family history of other congenital malformations, deformations and chromosomal abnormalities: Secondary | ICD-10-CM

## 2022-01-02 DIAGNOSIS — I77819 Aortic ectasia, unspecified site: Secondary | ICD-10-CM

## 2022-01-02 DIAGNOSIS — I77810 Thoracic aortic ectasia: Secondary | ICD-10-CM | POA: Diagnosis not present

## 2022-01-02 DIAGNOSIS — Q231 Congenital insufficiency of aortic valve: Secondary | ICD-10-CM | POA: Diagnosis not present

## 2022-01-02 MED ORDER — IOHEXOL 350 MG/ML SOLN
95.0000 mL | Freq: Once | INTRAVENOUS | Status: AC | PRN
  Administered 2022-01-02: 95 mL via INTRAVENOUS

## 2022-01-06 DIAGNOSIS — Z0184 Encounter for antibody response examination: Secondary | ICD-10-CM | POA: Diagnosis not present

## 2022-01-06 NOTE — Addendum Note (Signed)
Addended by: Rexanne Mano B on: 01/06/2022 02:20 PM   Modules accepted: Orders

## 2022-01-16 DIAGNOSIS — Z0279 Encounter for issue of other medical certificate: Secondary | ICD-10-CM | POA: Diagnosis not present

## 2022-01-20 DIAGNOSIS — J0140 Acute pansinusitis, unspecified: Secondary | ICD-10-CM | POA: Diagnosis not present

## 2022-01-20 DIAGNOSIS — H01113 Allergic dermatitis of right eye, unspecified eyelid: Secondary | ICD-10-CM | POA: Diagnosis not present

## 2022-01-20 DIAGNOSIS — H01116 Allergic dermatitis of left eye, unspecified eyelid: Secondary | ICD-10-CM | POA: Diagnosis not present

## 2022-03-20 ENCOUNTER — Other Ambulatory Visit (HOSPITAL_COMMUNITY): Payer: Self-pay

## 2022-03-20 MED ORDER — MOUNJARO 7.5 MG/0.5ML ~~LOC~~ SOAJ
SUBCUTANEOUS | 1 refills | Status: AC
  Filled 2022-03-20: qty 2, 28d supply, fill #0
  Filled 2022-04-22: qty 2, 28d supply, fill #1

## 2022-03-23 ENCOUNTER — Other Ambulatory Visit (HOSPITAL_COMMUNITY): Payer: Self-pay

## 2022-04-01 DIAGNOSIS — H5213 Myopia, bilateral: Secondary | ICD-10-CM | POA: Diagnosis not present

## 2022-04-29 ENCOUNTER — Other Ambulatory Visit (HOSPITAL_COMMUNITY): Payer: Self-pay

## 2022-05-07 ENCOUNTER — Other Ambulatory Visit: Payer: Self-pay

## 2022-05-13 ENCOUNTER — Other Ambulatory Visit (HOSPITAL_COMMUNITY): Payer: Self-pay

## 2022-05-13 MED ORDER — WEGOVY 0.5 MG/0.5ML ~~LOC~~ SOAJ
0.5000 mg | SUBCUTANEOUS | 0 refills | Status: AC
  Filled 2022-05-13: qty 2, 28d supply, fill #0
  Filled 2022-06-01 – 2022-06-09 (×2): qty 2, 28d supply, fill #1

## 2022-05-14 ENCOUNTER — Other Ambulatory Visit (HOSPITAL_COMMUNITY): Payer: Self-pay

## 2022-05-18 ENCOUNTER — Other Ambulatory Visit (HOSPITAL_COMMUNITY): Payer: Self-pay

## 2022-05-18 ENCOUNTER — Other Ambulatory Visit: Payer: Self-pay

## 2022-06-01 ENCOUNTER — Other Ambulatory Visit (HOSPITAL_COMMUNITY): Payer: Self-pay

## 2022-06-09 ENCOUNTER — Other Ambulatory Visit (HOSPITAL_COMMUNITY): Payer: Self-pay

## 2022-06-17 ENCOUNTER — Other Ambulatory Visit (HOSPITAL_COMMUNITY): Payer: Self-pay

## 2022-06-17 MED ORDER — LEVOCETIRIZINE DIHYDROCHLORIDE 5 MG PO TABS
5.0000 mg | ORAL_TABLET | Freq: Every evening | ORAL | 1 refills | Status: AC
  Filled 2022-06-17: qty 30, 30d supply, fill #0
  Filled 2023-01-13: qty 30, 30d supply, fill #1

## 2022-06-17 MED ORDER — AMOXICILLIN-POT CLAVULANATE 875-125 MG PO TABS
875.0000 mg | ORAL_TABLET | Freq: Two times a day (BID) | ORAL | 0 refills | Status: DC
  Filled 2022-06-17: qty 20, 10d supply, fill #0

## 2022-06-17 MED ORDER — WEGOVY 1 MG/0.5ML ~~LOC~~ SOAJ
1.0000 mg | SUBCUTANEOUS | 0 refills | Status: DC
  Filled 2022-06-17 – 2022-07-06 (×2): qty 2, 28d supply, fill #0

## 2022-07-06 ENCOUNTER — Other Ambulatory Visit (HOSPITAL_COMMUNITY): Payer: Self-pay

## 2022-07-26 ENCOUNTER — Other Ambulatory Visit (HOSPITAL_COMMUNITY): Payer: Self-pay

## 2022-07-27 ENCOUNTER — Other Ambulatory Visit (HOSPITAL_COMMUNITY): Payer: Self-pay

## 2022-07-27 MED ORDER — WEGOVY 1 MG/0.5ML ~~LOC~~ SOAJ
1.0000 mg | SUBCUTANEOUS | 0 refills | Status: DC
  Filled 2022-07-27: qty 2, 28d supply, fill #0

## 2022-08-26 ENCOUNTER — Other Ambulatory Visit (HOSPITAL_COMMUNITY): Payer: Self-pay

## 2022-08-27 ENCOUNTER — Other Ambulatory Visit (HOSPITAL_COMMUNITY): Payer: Self-pay

## 2022-08-27 MED ORDER — WEGOVY 1 MG/0.5ML ~~LOC~~ SOAJ
1.0000 mg | SUBCUTANEOUS | 0 refills | Status: DC
  Filled 2022-08-27 – 2022-11-09 (×2): qty 2, 28d supply, fill #0

## 2022-09-02 ENCOUNTER — Other Ambulatory Visit (HOSPITAL_COMMUNITY): Payer: Self-pay

## 2022-09-03 ENCOUNTER — Other Ambulatory Visit (HOSPITAL_COMMUNITY): Payer: Self-pay

## 2022-09-12 ENCOUNTER — Other Ambulatory Visit (HOSPITAL_COMMUNITY): Payer: Self-pay

## 2022-09-14 ENCOUNTER — Other Ambulatory Visit: Payer: Self-pay

## 2022-09-14 ENCOUNTER — Other Ambulatory Visit (HOSPITAL_COMMUNITY): Payer: Self-pay

## 2022-09-15 ENCOUNTER — Other Ambulatory Visit (HOSPITAL_COMMUNITY): Payer: Self-pay

## 2022-09-16 ENCOUNTER — Other Ambulatory Visit (HOSPITAL_COMMUNITY): Payer: Self-pay

## 2022-09-16 MED ORDER — WEGOVY 1 MG/0.5ML ~~LOC~~ SOAJ
1.0000 mg | SUBCUTANEOUS | 1 refills | Status: AC
  Filled 2022-09-16: qty 2, 28d supply, fill #0
  Filled 2022-10-12: qty 2, 28d supply, fill #1

## 2022-09-18 ENCOUNTER — Other Ambulatory Visit (HOSPITAL_COMMUNITY): Payer: Self-pay

## 2022-09-21 ENCOUNTER — Other Ambulatory Visit (HOSPITAL_COMMUNITY): Payer: Self-pay

## 2022-09-22 ENCOUNTER — Other Ambulatory Visit (HOSPITAL_COMMUNITY): Payer: Self-pay

## 2022-10-13 ENCOUNTER — Other Ambulatory Visit (HOSPITAL_COMMUNITY): Payer: Self-pay

## 2022-11-09 ENCOUNTER — Other Ambulatory Visit: Payer: Self-pay

## 2022-12-09 ENCOUNTER — Other Ambulatory Visit (HOSPITAL_COMMUNITY): Payer: Self-pay

## 2022-12-12 ENCOUNTER — Other Ambulatory Visit (HOSPITAL_COMMUNITY): Payer: Self-pay

## 2022-12-14 ENCOUNTER — Other Ambulatory Visit (HOSPITAL_COMMUNITY): Payer: Self-pay

## 2022-12-16 ENCOUNTER — Other Ambulatory Visit (HOSPITAL_COMMUNITY): Payer: Self-pay

## 2022-12-16 MED ORDER — AMOXICILLIN-POT CLAVULANATE 875-125 MG PO TABS
1.0000 | ORAL_TABLET | Freq: Two times a day (BID) | ORAL | 0 refills | Status: DC
  Filled 2022-12-16: qty 20, 10d supply, fill #0

## 2022-12-16 MED ORDER — LEVOCETIRIZINE DIHYDROCHLORIDE 5 MG PO TABS
5.0000 mg | ORAL_TABLET | Freq: Every evening | ORAL | 1 refills | Status: AC
  Filled 2022-12-16: qty 30, 30d supply, fill #0
  Filled 2023-07-03: qty 30, 30d supply, fill #1

## 2022-12-23 ENCOUNTER — Other Ambulatory Visit: Payer: Self-pay

## 2022-12-23 ENCOUNTER — Other Ambulatory Visit (HOSPITAL_COMMUNITY): Payer: Self-pay

## 2022-12-23 MED ORDER — WEGOVY 1 MG/0.5ML ~~LOC~~ SOAJ
1.0000 mg | SUBCUTANEOUS | 0 refills | Status: DC
  Filled 2022-12-23: qty 2, 28d supply, fill #0

## 2022-12-24 ENCOUNTER — Other Ambulatory Visit (HOSPITAL_COMMUNITY): Payer: Self-pay

## 2023-01-07 ENCOUNTER — Other Ambulatory Visit (HOSPITAL_COMMUNITY): Payer: Commercial Managed Care - PPO

## 2023-01-07 ENCOUNTER — Other Ambulatory Visit (HOSPITAL_BASED_OUTPATIENT_CLINIC_OR_DEPARTMENT_OTHER): Payer: Commercial Managed Care - PPO

## 2023-01-13 ENCOUNTER — Other Ambulatory Visit (HOSPITAL_COMMUNITY): Payer: Self-pay

## 2023-01-14 ENCOUNTER — Other Ambulatory Visit (HOSPITAL_COMMUNITY): Payer: Self-pay

## 2023-01-14 ENCOUNTER — Other Ambulatory Visit: Payer: Self-pay

## 2023-01-14 MED ORDER — WEGOVY 1 MG/0.5ML ~~LOC~~ SOAJ
1.0000 mg | SUBCUTANEOUS | 0 refills | Status: DC
  Filled 2023-01-14: qty 2, 28d supply, fill #0

## 2023-01-15 ENCOUNTER — Ambulatory Visit (HOSPITAL_BASED_OUTPATIENT_CLINIC_OR_DEPARTMENT_OTHER): Payer: 59

## 2023-01-15 ENCOUNTER — Ambulatory Visit: Payer: 59 | Attending: Internal Medicine | Admitting: Internal Medicine

## 2023-01-15 ENCOUNTER — Other Ambulatory Visit (HOSPITAL_COMMUNITY): Payer: Self-pay

## 2023-01-15 ENCOUNTER — Encounter: Payer: Self-pay | Admitting: Internal Medicine

## 2023-01-15 VITALS — BP 118/70 | HR 71 | Ht 65.0 in | Wt 156.0 lb

## 2023-01-15 DIAGNOSIS — I10 Essential (primary) hypertension: Secondary | ICD-10-CM | POA: Diagnosis not present

## 2023-01-15 DIAGNOSIS — I77819 Aortic ectasia, unspecified site: Secondary | ICD-10-CM | POA: Diagnosis not present

## 2023-01-15 DIAGNOSIS — Z8279 Family history of other congenital malformations, deformations and chromosomal abnormalities: Secondary | ICD-10-CM | POA: Diagnosis not present

## 2023-01-15 DIAGNOSIS — O133 Gestational [pregnancy-induced] hypertension without significant proteinuria, third trimester: Secondary | ICD-10-CM

## 2023-01-15 LAB — ECHOCARDIOGRAM COMPLETE
AR max vel: 2.69 cm2
AV Area VTI: 2.24 cm2
AV Area mean vel: 2.44 cm2
AV Mean grad: 4 mm[Hg]
AV Peak grad: 6.9 mm[Hg]
Ao pk vel: 1.31 m/s
Area-P 1/2: 6.07 cm2
S' Lateral: 2.96 cm

## 2023-01-15 NOTE — Progress Notes (Signed)
Cardiology Office Note:  .   Date:  01/16/2023  ID:  Barbara Scott, DOB December 01, 1985, MRN [id number] PCP: Exie Parody  [facility] Providers Cardiologist:  Parke Poisson, MD    History of Present Illness: .   Barbara Scott is a 37 y.o. female.  Discussed the use of AI scribe software for clinical note transcription with the patient, who gave verbal consent to proceed.  History of Present Illness   The patient, with a history of hypertension and anemia, presents for a follow-up visit. The patient's son was diagnosed with congenital heart disease, bicuspid aortic valve, and probable PFO, raising concerns about potential inherited cardiac issues. The patient's last visit in September 2023 involved an echocardiogram and a coronary CTA, which showed a mildly ectatic ascending aorta. The patient reports feeling well with no new health changes. The patient has stopped taking hydrochlorothiazide due to consistently normal blood pressure readings. The patient has a family history of high blood pressure and coronary artery disease.        ROS: negative except per HPI above.  Studies Reviewed: Marland Kitchen   EKG Interpretation Date/Time:  Friday January 15 2023 15:22:01 EDT Ventricular Rate:  71 PR Interval:  186 QRS Duration:  84 QT Interval:  360 QTC Calculation: 391 R Axis:   1  Text Interpretation: Normal sinus rhythm Normal ECG Confirmed by Weston Brass ([id number]) on 01/15/2023 4:19:49 PM    Results   LABS Lipid Panel: Triglycerides 47, HDL 54, LDL 61 (12/17/2022) Hb: 14  RADIOLOGY Coronary CTA: Coronary calcium score 0, mid ascending aorta 37 mm, aortic valve tricuspid, normal coronary origins, left coronary dominance (12/2021)  DIAGNOSTIC Echocardiogram: Normal global longitudinal strain, normal diastolic function, normal right ventricle, grossly normal cardiac valves, mild dilation of the ascending aorta 39 mm (12/2021) EKG: Normal (01/15/2023)     Risk  Assessment/Calculations:             Physical Exam:   VS:  BP 118/70   Pulse 71   Ht 5\' 5"  (1.651 m)   Wt 156 lb (70.8 kg)   LMP 01/20/2017 (Exact Date)   SpO2 100%   BMI 25.96 kg/m    Wt Readings from Last 3 Encounters:  01/15/23 156 lb (70.8 kg)  12/22/21 169 lb (76.7 kg)  03/03/21 205 lb 14.4 oz (93.4 kg)     Physical Exam   VITALS: BP 110/82 CHEST: Lungs clear to auscultation. CARDIOVASCULAR: EKG normal, heart rate normal, aortic valve appears normal, possible faint flow murmur noted.     GEN: Well nourished, well developed in no acute distress NECK: No JVD; No carotid bruits CARDIAC: RRR, no murmurs, rubs, gallops RESPIRATORY:  Clear to auscultation without rales, wheezing or rhonchi  ABDOMEN: Soft, non-tender, non-distended EXTREMITIES:  No edema; No deformity   ASSESSMENT AND PLAN: .    1. Aortic ectasia (HCC)   2. Primary hypertension   3. Family history of bicuspid aortic valve     Assessment and Plan    Aortic Ectasia Stable mild ectasia of the ascending aorta (37mm on previous CT). No significant change in size. Discussed the importance of maintaining normal blood pressure and avoiding heavy lifting to prevent further dilation. She may have inherited a bicuspid aortopathy.  -Continue monitoring with periodic echocardiograms.  Hypertension Well controlled off medication for over a year. Discussed the importance of regular home blood pressure monitoring. -Check blood pressure at home 3-4 times a week. - improved with weight loss.  Hyperlipidemia LDL improved from 87 to 61. Discussed the importance of maintaining LDL less than 70 given family history of coronary artery disease. -Continue current lifestyle modifications. -Check Lipoprotein A at next cholesterol check.  History of Anemia Hemoglobin currently normal at 14. Previously had low hemoglobin causing a flow murmur. -Continue monitoring hemoglobin levels.  Follow-up in 1 year or sooner if any  concerns arise.                Total time of encounter: 25 minutes total time of encounter, including 15 minutes spent in face-to-face patient care on the date of this encounter. This time includes coordination of care and counseling regarding above mentioned problem list. Remainder of non-face-to-face time involved reviewing chart documents/testing relevant to the patient encounter and documentation in the medical record. I have independently reviewed documentation from referring provider.   Weston Brass, MD, [facility] [place]  [facility]

## 2023-01-15 NOTE — Patient Instructions (Signed)
Medication Instructions:  Your physician recommends that you continue on your current medications as directed. Please refer to the Current Medication list given to you today.  *If you need a refill on your cardiac medications before your next appointment, please call your pharmacy*   Follow-Up: At [facility], you and your health needs are our priority.  As part of our continuing mission to provide you with exceptional heart care, we have created designated Provider Care Teams.  These Care Teams include your primary Cardiologist (physician) and Advanced Practice Providers (APPs -  Physician Assistants and Nurse Practitioners) who all work together to provide you with the care you need, when you need it.  We recommend signing up for the patient portal called "MyChart".  Sign up information is provided on this After Visit Summary.  MyChart is used to connect with patients for Virtual Visits (Telemedicine).  Patients are able to view lab/test results, encounter notes, upcoming appointments, etc.  Non-urgent messages can be sent to your provider as well.   To learn more about what you can do with MyChart, go to [email or web address].    Your next appointment:   12 month(s)  Provider:   Gayatri A Acharya, MD    

## 2023-01-27 ENCOUNTER — Other Ambulatory Visit (HOSPITAL_COMMUNITY): Payer: Self-pay

## 2023-01-27 MED ORDER — CIPROFLOXACIN HCL 500 MG PO TABS
500.0000 mg | ORAL_TABLET | Freq: Two times a day (BID) | ORAL | 1 refills | Status: DC
  Filled 2023-01-27: qty 14, 7d supply, fill #0
  Filled 2023-02-11: qty 14, 7d supply, fill #1

## 2023-01-28 ENCOUNTER — Other Ambulatory Visit (HOSPITAL_COMMUNITY): Payer: Self-pay

## 2023-02-11 ENCOUNTER — Other Ambulatory Visit (HOSPITAL_COMMUNITY): Payer: Self-pay

## 2023-02-12 ENCOUNTER — Other Ambulatory Visit (HOSPITAL_COMMUNITY): Payer: Self-pay

## 2023-02-12 ENCOUNTER — Other Ambulatory Visit: Payer: Self-pay

## 2023-02-12 MED ORDER — WEGOVY 1 MG/0.5ML ~~LOC~~ SOAJ
1.0000 mg | SUBCUTANEOUS | 0 refills | Status: DC
  Filled 2023-02-12: qty 2, 28d supply, fill #0

## 2023-02-23 ENCOUNTER — Other Ambulatory Visit (HOSPITAL_COMMUNITY): Payer: Self-pay

## 2023-03-12 ENCOUNTER — Other Ambulatory Visit (HOSPITAL_COMMUNITY): Payer: Self-pay

## 2023-03-12 MED ORDER — WEGOVY 1 MG/0.5ML ~~LOC~~ SOAJ
1.0000 mg | SUBCUTANEOUS | 0 refills | Status: DC
  Filled 2023-03-12: qty 2, 28d supply, fill #0

## 2023-03-15 ENCOUNTER — Other Ambulatory Visit (HOSPITAL_COMMUNITY): Payer: Self-pay

## 2023-04-12 ENCOUNTER — Other Ambulatory Visit (HOSPITAL_COMMUNITY): Payer: Self-pay

## 2023-04-12 MED ORDER — WEGOVY 1 MG/0.5ML ~~LOC~~ SOAJ
1.0000 mg | SUBCUTANEOUS | 0 refills | Status: DC
  Filled 2023-04-12: qty 2, 28d supply, fill #0

## 2023-05-07 ENCOUNTER — Other Ambulatory Visit (HOSPITAL_COMMUNITY): Payer: Self-pay

## 2023-05-13 ENCOUNTER — Other Ambulatory Visit (HOSPITAL_COMMUNITY): Payer: Self-pay

## 2023-05-13 MED ORDER — WEGOVY 1 MG/0.5ML ~~LOC~~ SOAJ
SUBCUTANEOUS | 0 refills | Status: DC
  Filled 2023-05-13: qty 2, 28d supply, fill #0

## 2023-05-14 ENCOUNTER — Other Ambulatory Visit (HOSPITAL_COMMUNITY): Payer: Self-pay

## 2023-05-21 ENCOUNTER — Other Ambulatory Visit: Payer: Self-pay | Admitting: Obstetrics and Gynecology

## 2023-05-21 DIAGNOSIS — Z803 Family history of malignant neoplasm of breast: Secondary | ICD-10-CM

## 2023-05-24 ENCOUNTER — Other Ambulatory Visit (HOSPITAL_COMMUNITY): Payer: Self-pay

## 2023-05-24 MED ORDER — WEGOVY 1 MG/0.5ML ~~LOC~~ SOAJ
0.5000 mL | SUBCUTANEOUS | 5 refills | Status: AC
  Filled 2023-05-24 – 2023-06-08 (×2): qty 2, 28d supply, fill #0
  Filled 2023-07-03: qty 2, 28d supply, fill #1
  Filled 2023-08-03: qty 2, 28d supply, fill #2
  Filled 2023-08-31: qty 2, 28d supply, fill #3
  Filled 2023-09-24 – 2023-12-02 (×4): qty 2, 28d supply, fill #4

## 2023-06-08 ENCOUNTER — Other Ambulatory Visit (HOSPITAL_COMMUNITY): Payer: Self-pay

## 2023-09-24 ENCOUNTER — Other Ambulatory Visit (HOSPITAL_COMMUNITY): Payer: Self-pay

## 2023-09-25 ENCOUNTER — Other Ambulatory Visit (HOSPITAL_COMMUNITY): Payer: Self-pay

## 2023-09-28 ENCOUNTER — Other Ambulatory Visit (HOSPITAL_COMMUNITY): Payer: Self-pay

## 2023-09-29 ENCOUNTER — Other Ambulatory Visit (HOSPITAL_COMMUNITY): Payer: Self-pay

## 2023-09-30 ENCOUNTER — Other Ambulatory Visit (HOSPITAL_COMMUNITY): Payer: Self-pay

## 2023-10-06 ENCOUNTER — Other Ambulatory Visit (HOSPITAL_COMMUNITY): Payer: Self-pay

## 2023-10-06 MED ORDER — WEGOVY 1.7 MG/0.75ML ~~LOC~~ SOAJ
1.7000 mg | SUBCUTANEOUS | 0 refills | Status: DC
  Filled 2023-10-06: qty 3, 28d supply, fill #0

## 2023-10-11 ENCOUNTER — Other Ambulatory Visit (HOSPITAL_COMMUNITY): Payer: Self-pay

## 2023-11-02 ENCOUNTER — Other Ambulatory Visit (HOSPITAL_COMMUNITY): Payer: Self-pay

## 2023-11-03 ENCOUNTER — Other Ambulatory Visit (HOSPITAL_COMMUNITY): Payer: Self-pay

## 2023-11-03 MED ORDER — WEGOVY 1.7 MG/0.75ML ~~LOC~~ SOAJ
1.7000 mg | SUBCUTANEOUS | 0 refills | Status: DC
  Filled 2023-11-03: qty 3, 28d supply, fill #0

## 2023-12-02 ENCOUNTER — Other Ambulatory Visit (HOSPITAL_COMMUNITY): Payer: Self-pay

## 2023-12-03 ENCOUNTER — Other Ambulatory Visit (HOSPITAL_COMMUNITY): Payer: Self-pay

## 2023-12-07 ENCOUNTER — Encounter (HOSPITAL_COMMUNITY): Payer: Self-pay

## 2023-12-07 ENCOUNTER — Other Ambulatory Visit (HOSPITAL_COMMUNITY): Payer: Self-pay

## 2023-12-16 ENCOUNTER — Other Ambulatory Visit (HOSPITAL_COMMUNITY): Payer: Self-pay

## 2023-12-16 MED ORDER — WEGOVY 1.7 MG/0.75ML ~~LOC~~ SOAJ
1.7000 mg | SUBCUTANEOUS | 2 refills | Status: DC
  Filled 2023-12-16: qty 3, 28d supply, fill #0
  Filled 2024-01-10: qty 3, 28d supply, fill #1
  Filled 2024-02-07: qty 3, 28d supply, fill #2

## 2023-12-16 MED ORDER — LEVOCETIRIZINE DIHYDROCHLORIDE 5 MG PO TABS
5.0000 mg | ORAL_TABLET | Freq: Every evening | ORAL | 1 refills | Status: AC
  Filled 2023-12-16: qty 30, 30d supply, fill #0
  Filled 2024-02-07: qty 30, 30d supply, fill #1
  Filled 2024-03-06: qty 30, 30d supply, fill #2

## 2023-12-20 ENCOUNTER — Other Ambulatory Visit (HOSPITAL_COMMUNITY): Payer: Self-pay

## 2023-12-20 MED ORDER — AZITHROMYCIN 250 MG PO TABS
ORAL_TABLET | ORAL | 0 refills | Status: DC
  Filled 2023-12-20: qty 6, 5d supply, fill #0

## 2023-12-27 ENCOUNTER — Ambulatory Visit (HOSPITAL_COMMUNITY)
Admission: RE | Admit: 2023-12-27 | Discharge: 2023-12-27 | Disposition: A | Discharge status: Home / Self Care | Source: Ambulatory Visit | Attending: Cardiology | Admitting: Cardiology

## 2023-12-27 ENCOUNTER — Other Ambulatory Visit (HOSPITAL_COMMUNITY): Payer: Self-pay | Admitting: Internal Medicine

## 2023-12-27 DIAGNOSIS — I712 Thoracic aortic aneurysm, without rupture, unspecified: Secondary | ICD-10-CM

## 2023-12-27 LAB — ECHOCARDIOGRAM COMPLETE: S' Lateral: 2.59 cm

## 2024-01-27 ENCOUNTER — Ambulatory Visit: Payer: Self-pay | Admitting: Internal Medicine

## 2024-02-07 ENCOUNTER — Ambulatory Visit: Attending: Internal Medicine | Admitting: Internal Medicine

## 2024-02-07 VITALS — BP 100/72 | HR 77 | Ht 65.0 in | Wt 153.0 lb

## 2024-02-07 DIAGNOSIS — I77810 Thoracic aortic ectasia: Secondary | ICD-10-CM

## 2024-02-07 DIAGNOSIS — I10 Essential (primary) hypertension: Secondary | ICD-10-CM

## 2024-02-07 DIAGNOSIS — I77819 Aortic ectasia, unspecified site: Secondary | ICD-10-CM

## 2024-02-07 DIAGNOSIS — Z8279 Family history of other congenital malformations, deformations and chromosomal abnormalities: Secondary | ICD-10-CM

## 2024-02-07 DIAGNOSIS — E785 Hyperlipidemia, unspecified: Secondary | ICD-10-CM

## 2024-02-07 DIAGNOSIS — I712 Thoracic aortic aneurysm, without rupture, unspecified: Secondary | ICD-10-CM

## 2024-02-07 NOTE — Progress Notes (Signed)
 Cardiology Office Note:  .   Date:  02/07/2024  ID:  Barbara Scott, DOB [birth year], MRN [id number] PCP: Teresa Aldona CROME, NP  [facility] Providers Cardiologist:  Soyla DELENA Merck, MD    History of Present Illness: .   Barbara Scott is a 38 y.o. female.  Discussed the use of AI scribe software for clinical note transcription with the patient, who gave verbal consent to proceed.  History of Present Illness Barbara Scott is a 38 year old female with a history of a mildly ectatic ascending aorta who presents for follow-up of her cardiovascular condition.  Her ascending aorta measures 37 millimeters on echocardiogram. She experiences no chest pain or shortness of breath. Her blood pressure is well controlled at 100/72 mmHg without medication. She manages hyperlipidemia with lifestyle modifications.  Her family history includes significant cardiac issues. Her son has a bicuspid aortic valve. Her father died unexpectedly with symptoms of shortness of breath, suggesting a possible heart-related cause.  She experienced hypertension during pregnancy and previously required medication, but currently does not take antihypertensive drugs. She denies any symptoms related to her blood pressure, chest pain, shortness of breath, or ankle swelling.    ROS: negative except per HPI above.  Studies Reviewed: SABRA   EKG Interpretation Date/Time:  Monday February 07 2024 08:21:13 EST Ventricular Rate:  75 PR Interval:  192 QRS Duration:  80 QT Interval:  360 QTC Calculation: 402 R Axis:   14  Text Interpretation: Normal sinus rhythm Normal ECG Confirmed by Merck Soyla ([id number]) on 02/07/2024 9:39:58 AM    Results RADIOLOGY Coronary CT scan: Zero calcium  score, normal coronary arteries (2023)  DIAGNOSTIC Echocardiogram: Tricuspid aortic valve, mildly ectatic ascending aorta at 37 mm EKG: Normal (12/2023) Risk Assessment/Calculations:       Physical Exam:   VS:  BP 100/72 (BP  Location: Left Arm, Patient Position: Sitting, Cuff Size: Normal)   Pulse 77   Ht 5' 5 (1.651 m)   Wt 153 lb (69.4 kg)   LMP 01/20/2017 (Exact Date)   SpO2 99%   BMI 25.46 kg/m    Wt Readings from Last 3 Encounters:  02/07/24 153 lb (69.4 kg)  01/15/23 156 lb (70.8 kg)  12/22/21 169 lb (76.7 kg)     Physical Exam VITALS: BP- 100/72 GENERAL: Alert, cooperative, well developed, no acute distress, normal exam findings. HEENT: Normocephalic, normal oropharynx, moist mucous membranes. CHEST: Clear to auscultation bilaterally, no wheezes, rhonchi, or crackles. CARDIOVASCULAR: Normal heart rate and rhythm, S1 and S2 normal without murmurs. ABDOMEN: Soft, non-tender, non-distended, without organomegaly, normal bowel sounds. EXTREMITIES: No cyanosis or edema. NEUROLOGICAL: Cranial nerves grossly intact, moves all extremities without gross motor or sensory deficit.   ASSESSMENT AND PLAN: .    Assessment and Plan Assessment & Plan Thoracic aortic ectasia, ascending aorta Mildly ectatic ascending aorta at 37 mm, stable. Family history suggests potential genetic predisposition. Emphasized monitoring due to family cardiac history and father's sudden death. - Order echocardiogram in one year to monitor aortic size, it appears stable at 37 mm on my review, suspect echo measurement of 40 mm was overmeasurement. - Order CT angiography of the aorta next year for correlation and stability assessment if echo shows discrepancies. - Advise against heavy lifting over 50 pounds. - Discuss potential for genetic testing in the future.  Essential hypertension Hypertension well-controlled without medication. Blood pressure at 100/72 mmHg, asymptomatic. - Monitor blood pressure regularly. - Advise to report any symptoms or concerns.  Hyperlipidemia Managed with lifestyle modifications. Family history of cardiovascular issues noted. - Continue lifestyle modifications.  Recording duration: 29  minutes      Soyla Merck, MD, FACC

## 2024-02-07 NOTE — Patient Instructions (Addendum)
 Medication Instructions:  No Changes  Lab Work: None  Testing/Procedures: Your physician has requested that you have an echocardiogram in October 2026, prior to your one year follow up with Dr. Loni. Echocardiography is a painless test that uses sound waves to create images of your heart. It provides your doctor with information about the size and shape of your heart and how well your heart's chambers and valves are working. This procedure takes approximately one hour. There are no restrictions for this procedure. Please do NOT wear cologne, perfume, aftershave, or lotions (deodorant is allowed). Please arrive 15 minutes prior to your appointment time.  Please note: We ask at that you not bring children with you during ultrasound (echo/ vascular) testing. Due to room size and safety concerns, children are not allowed in the ultrasound rooms during exams. Our front office staff cannot provide observation of children in our lobby area while testing is being conducted. An adult accompanying a patient to their appointment will only be allowed in the ultrasound room at the discretion of the ultrasound technician under special circumstances. We apologize for any inconvenience.    Non-Cardiac CT Angiography (CTA) (GATED), in October 2026, prior to your one year follow up  is a special type of CT scan that uses a computer to produce multi-dimensional views of major blood vessels throughout the body. In CT angiography, a contrast material is injected through an IV to help visualize the blood vessels   Follow-Up: At [facility], you and your health needs are our priority.  As part of our continuing mission to provide you with exceptional heart care, our providers are all part of one team.  This team includes your primary Cardiologist (physician) and Advanced Practice Providers or APPs (Physician Assistants and Nurse Practitioners) who all work together to provide you with the care you need, when  you need it.  Your next appointment:   1 year(s) after Tests (Echo and Gated CT Angio Chest Aorta)  Provider:   Soyla DELENA Loni, MD

## 2024-03-06 ENCOUNTER — Other Ambulatory Visit (HOSPITAL_COMMUNITY): Payer: Self-pay

## 2024-03-06 ENCOUNTER — Other Ambulatory Visit: Payer: Self-pay

## 2024-03-06 MED ORDER — WEGOVY 1.7 MG/0.75ML ~~LOC~~ SOAJ
1.7000 mg | SUBCUTANEOUS | 2 refills | Status: AC
  Filled 2024-03-06: qty 3, 28d supply, fill #0
  Filled 2024-04-01: qty 3, 28d supply, fill #1
  Filled 2024-04-28: qty 3, 28d supply, fill #2

## 2024-04-03 ENCOUNTER — Other Ambulatory Visit: Payer: Self-pay

## 2024-04-28 ENCOUNTER — Other Ambulatory Visit (HOSPITAL_COMMUNITY): Payer: Self-pay

## 2024-05-11 ENCOUNTER — Encounter: Payer: Self-pay | Admitting: Internal Medicine
